# Patient Record
Sex: Female | Born: 1935
Health system: Southern US, Community
[De-identification: ages and names within clinical notes are randomized; demographics above are authoritative.]

## PROBLEM LIST (undated history)

## (undated) DIAGNOSIS — I10 Essential (primary) hypertension: Secondary | ICD-10-CM

## (undated) DIAGNOSIS — M797 Fibromyalgia: Secondary | ICD-10-CM

## (undated) DIAGNOSIS — G2581 Restless legs syndrome: Secondary | ICD-10-CM

## (undated) HISTORY — DX: Fibromyalgia: M79.7

## (undated) HISTORY — PX: COLONOSCOPY: SHX174

## (undated) HISTORY — DX: Essential (primary) hypertension: I10

## (undated) HISTORY — PX: OTHER SURGICAL HISTORY: SHX169

## (undated) HISTORY — DX: Restless legs syndrome: G25.81

---

## 2002-11-07 ENCOUNTER — Encounter: Payer: Self-pay | Admitting: Internal Medicine

## 2002-11-07 ENCOUNTER — Ambulatory Visit (HOSPITAL_COMMUNITY): Admission: RE | Admit: 2002-11-07 | Discharge: 2002-11-07 | Payer: Self-pay | Admitting: Internal Medicine

## 2004-03-28 ENCOUNTER — Ambulatory Visit (HOSPITAL_COMMUNITY): Admission: RE | Admit: 2004-03-28 | Discharge: 2004-03-28 | Payer: Self-pay | Admitting: Family Medicine

## 2004-10-27 ENCOUNTER — Ambulatory Visit: Payer: Self-pay | Admitting: Family Medicine

## 2004-12-15 ENCOUNTER — Ambulatory Visit: Payer: Self-pay | Admitting: Family Medicine

## 2005-01-19 ENCOUNTER — Ambulatory Visit (HOSPITAL_COMMUNITY): Admission: RE | Admit: 2005-01-19 | Discharge: 2005-01-19 | Payer: Self-pay | Admitting: General Surgery

## 2005-02-21 ENCOUNTER — Ambulatory Visit (HOSPITAL_COMMUNITY): Admission: RE | Admit: 2005-02-21 | Discharge: 2005-02-21 | Payer: Self-pay | Admitting: Family Medicine

## 2005-02-28 ENCOUNTER — Ambulatory Visit: Payer: Self-pay | Admitting: Family Medicine

## 2005-03-29 ENCOUNTER — Ambulatory Visit: Payer: Self-pay | Admitting: Family Medicine

## 2005-03-30 ENCOUNTER — Ambulatory Visit: Payer: Self-pay | Admitting: Family Medicine

## 2005-03-30 ENCOUNTER — Ambulatory Visit (HOSPITAL_COMMUNITY): Admission: RE | Admit: 2005-03-30 | Discharge: 2005-03-30 | Payer: Self-pay | Admitting: Family Medicine

## 2005-04-04 ENCOUNTER — Ambulatory Visit: Payer: Self-pay | Admitting: Family Medicine

## 2005-04-06 ENCOUNTER — Ambulatory Visit (HOSPITAL_COMMUNITY): Admission: RE | Admit: 2005-04-06 | Discharge: 2005-04-06 | Payer: Self-pay | Admitting: Family Medicine

## 2005-07-17 ENCOUNTER — Ambulatory Visit: Payer: Self-pay | Admitting: Family Medicine

## 2005-11-27 ENCOUNTER — Ambulatory Visit: Payer: Self-pay | Admitting: Family Medicine

## 2006-02-27 ENCOUNTER — Ambulatory Visit: Payer: Self-pay | Admitting: Family Medicine

## 2006-03-08 ENCOUNTER — Ambulatory Visit: Payer: Self-pay | Admitting: Family Medicine

## 2006-03-09 ENCOUNTER — Ambulatory Visit (HOSPITAL_COMMUNITY): Admission: RE | Admit: 2006-03-09 | Discharge: 2006-03-09 | Payer: Self-pay | Admitting: Family Medicine

## 2006-03-20 ENCOUNTER — Ambulatory Visit: Payer: Self-pay | Admitting: Family Medicine

## 2006-03-21 ENCOUNTER — Ambulatory Visit (HOSPITAL_COMMUNITY): Admission: RE | Admit: 2006-03-21 | Discharge: 2006-03-21 | Payer: Self-pay | Admitting: Family Medicine

## 2006-04-02 ENCOUNTER — Ambulatory Visit (HOSPITAL_COMMUNITY): Admission: RE | Admit: 2006-04-02 | Discharge: 2006-04-02 | Payer: Self-pay | Admitting: Family Medicine

## 2006-04-23 ENCOUNTER — Other Ambulatory Visit: Admission: RE | Admit: 2006-04-23 | Discharge: 2006-04-23 | Payer: Self-pay | Admitting: Family Medicine

## 2006-04-23 ENCOUNTER — Ambulatory Visit: Payer: Self-pay | Admitting: Family Medicine

## 2006-05-23 ENCOUNTER — Ambulatory Visit (HOSPITAL_COMMUNITY): Admission: RE | Admit: 2006-05-23 | Discharge: 2006-05-23 | Payer: Self-pay | Admitting: General Surgery

## 2006-07-18 ENCOUNTER — Ambulatory Visit (HOSPITAL_COMMUNITY): Admission: RE | Admit: 2006-07-18 | Discharge: 2006-07-18 | Payer: Self-pay | Admitting: Internal Medicine

## 2006-07-18 ENCOUNTER — Encounter (INDEPENDENT_AMBULATORY_CARE_PROVIDER_SITE_OTHER): Payer: Self-pay | Admitting: Specialist

## 2006-08-27 ENCOUNTER — Ambulatory Visit: Payer: Self-pay | Admitting: Family Medicine

## 2006-09-14 ENCOUNTER — Ambulatory Visit: Payer: Self-pay | Admitting: Family Medicine

## 2006-11-27 ENCOUNTER — Ambulatory Visit: Payer: Self-pay | Admitting: Family Medicine

## 2006-12-11 ENCOUNTER — Ambulatory Visit (HOSPITAL_COMMUNITY): Admission: RE | Admit: 2006-12-11 | Discharge: 2006-12-11 | Payer: Self-pay | Admitting: Family Medicine

## 2006-12-11 ENCOUNTER — Ambulatory Visit: Payer: Self-pay | Admitting: Family Medicine

## 2006-12-27 ENCOUNTER — Encounter: Payer: Self-pay | Admitting: Family Medicine

## 2006-12-27 LAB — CONVERTED CEMR LAB
ALT: 11 units/L (ref 0–35)
Alkaline Phosphatase: 60 units/L (ref 39–117)
Basophils Relative: 0 % (ref 0–1)
Chloride: 101 meq/L (ref 96–112)
Cholesterol: 155 mg/dL (ref 0–200)
Creatinine, Ser: 0.89 mg/dL (ref 0.40–1.20)
Glucose, Bld: 101 mg/dL — ABNORMAL HIGH (ref 70–99)
HCT: 37.7 % (ref 36.0–46.0)
HDL: 36 mg/dL — ABNORMAL LOW (ref >39)
Hemoglobin: 11.6 g/dL — ABNORMAL LOW (ref 12.0–15.0)
LDL Cholesterol: 92 mg/dL (ref 0–99)
Lymphocytes Relative: 27 % (ref 12–46)
Lymphs Abs: 2.1 10*3/uL (ref 0.7–3.3)
Monocytes Relative: 7 % (ref 3–11)
Neutro Abs: 4.7 10*3/uL (ref 1.7–7.7)
Platelets: 326 10*3/uL (ref 150–400)
RBC: 4.28 M/uL (ref 3.87–5.11)
RDW: 13.6 % (ref 11.5–14.0)
Sodium: 139 meq/L (ref 135–145)
TSH: 1.209 microintl units/mL (ref 0.350–5.50)
Total Protein: 7.2 g/dL (ref 6.0–8.3)
Triglycerides: 135 mg/dL (ref <150)

## 2007-01-01 ENCOUNTER — Ambulatory Visit: Payer: Self-pay | Admitting: Family Medicine

## 2007-01-08 ENCOUNTER — Ambulatory Visit: Payer: Self-pay | Admitting: Family Medicine

## 2007-01-09 ENCOUNTER — Encounter: Payer: Self-pay | Admitting: Family Medicine

## 2007-02-14 ENCOUNTER — Ambulatory Visit: Payer: Self-pay | Admitting: Family Medicine

## 2007-02-15 ENCOUNTER — Encounter: Payer: Self-pay | Admitting: Family Medicine

## 2007-02-15 LAB — CONVERTED CEMR LAB
Ketones, ur: NEGATIVE mg/dL
Nitrite: NEGATIVE
Specific Gravity, Urine: 1.017 (ref 1.005–1.03)
Urobilinogen, UA: 0.2 (ref 0.0–1.0)
pH: 6 (ref 5.0–8.0)

## 2007-03-28 ENCOUNTER — Ambulatory Visit: Payer: Self-pay | Admitting: Family Medicine

## 2007-03-28 ENCOUNTER — Ambulatory Visit (HOSPITAL_COMMUNITY): Admission: RE | Admit: 2007-03-28 | Discharge: 2007-03-28 | Payer: Self-pay | Admitting: Family Medicine

## 2007-03-28 LAB — CONVERTED CEMR LAB
BUN: 16 mg/dL (ref 6–23)
Calcium: 9.6 mg/dL (ref 8.4–10.5)
Chloride: 102 meq/L (ref 96–112)
Potassium: 4.1 meq/L (ref 3.5–5.3)

## 2007-04-05 ENCOUNTER — Ambulatory Visit (HOSPITAL_COMMUNITY): Admission: RE | Admit: 2007-04-05 | Discharge: 2007-04-05 | Payer: Self-pay | Admitting: Family Medicine

## 2007-04-05 ENCOUNTER — Ambulatory Visit: Payer: Self-pay | Admitting: Family Medicine

## 2007-05-16 ENCOUNTER — Ambulatory Visit: Payer: Self-pay | Admitting: Family Medicine

## 2007-06-04 ENCOUNTER — Ambulatory Visit: Payer: Self-pay | Admitting: Family Medicine

## 2007-07-02 ENCOUNTER — Ambulatory Visit: Payer: Self-pay | Admitting: Family Medicine

## 2007-09-03 ENCOUNTER — Ambulatory Visit: Payer: Self-pay | Admitting: Family Medicine

## 2007-10-17 ENCOUNTER — Encounter: Payer: Self-pay | Admitting: Family Medicine

## 2008-04-14 ENCOUNTER — Ambulatory Visit (HOSPITAL_COMMUNITY): Admission: RE | Admit: 2008-04-14 | Discharge: 2008-04-14 | Payer: Self-pay | Admitting: Internal Medicine

## 2009-04-26 ENCOUNTER — Ambulatory Visit (HOSPITAL_COMMUNITY): Admission: RE | Admit: 2009-04-26 | Discharge: 2009-04-26 | Payer: Self-pay | Admitting: Internal Medicine

## 2010-03-17 ENCOUNTER — Ambulatory Visit (HOSPITAL_COMMUNITY): Admission: RE | Admit: 2010-03-17 | Discharge: 2010-03-17 | Payer: Self-pay | Admitting: Internal Medicine

## 2010-03-24 ENCOUNTER — Ambulatory Visit (HOSPITAL_COMMUNITY): Admission: RE | Admit: 2010-03-24 | Discharge: 2010-03-24 | Payer: Self-pay | Admitting: Obstetrics and Gynecology

## 2010-05-17 ENCOUNTER — Ambulatory Visit (HOSPITAL_COMMUNITY): Admission: RE | Admit: 2010-05-17 | Discharge: 2010-05-17 | Payer: Self-pay | Admitting: Internal Medicine

## 2010-11-15 NOTE — Letter (Signed)
Summary: RPC chart  RPC chart   Imported By: Curtis Sites 05/24/2010 10:54:58  _____________________________________________________________________  External Attachment:    Type:   Image     Comment:   External Document

## 2011-03-03 NOTE — H&P (Signed)
NAME:  [name] [name], [name] [name]                  ACCOUNT NO.:  [id number]   MEDICAL RECORD NO.:  [id number]          PATIENT TYPE:  AMB   LOCATION:                                 FACILITY:   PHYSICIAN:  Dalia Heading, M.D.  DATE OF BIRTH:  25-May-1933   DATE OF ADMISSION:  DATE OF DISCHARGE:  LH                                HISTORY & PHYSICAL   CHIEF COMPLAINT:  Need for screening colonoscopy.   HISTORY OF PRESENT ILLNESS:  The patient is a 77 year old black female who  is referred for endoscopic evaluation.  She needs a colonoscopy for  screening purposes.  No abdominal pain, weight loss, nausea or vomiting,  diarrhea, constipation, melena or hematochezia have been noted.  She has  never had a colonoscopy.  There is no family history of colon carcinoma.   PAST MEDICAL HISTORY:  Hypertension.   PAST SURGICAL HISTORY:  Unremarkable.   CURRENT MEDICATIONS:  Univasc.   ALLERGIES:  ASPIRIN.  PENICILLIN.  MUSHROOMS.   REVIEW OF SYSTEMS:  Noncontributory.   PHYSICAL EXAMINATION:  GENERAL:  The patient is a well-developed, well-  nourished black female in no acute distress.  VITAL SIGNS:  She is afebrile and vital signs are stable.  LUNGS:  Clear to auscultation with equal breath sounds bilaterally.  HEART:  Examination reveals a regular rate and rhythm without S3, S4 or  murmurs.  ABDOMEN:  Soft, nontender, nondistended.  No hepatosplenomegaly or masses  are noted.  RECTAL:  Examination was deferred to the procedure.   IMPRESSION:  Need for screening colonoscopy.   PLAN:  The patient is scheduled for a colonoscopy on January 19, 2005.  The  risks and benefits of the procedure including bleeding and perforation were  fully explained to the patient, gave informed consent.      MAJ/MEDQ  D:  01/10/2005  T:  01/10/2005  Job:  [id number]   cc:   Milus Mallick. Lodema Hong, M.D.  [address]  [place], [place] [address]  Fax: [phone]

## 2011-03-03 NOTE — Op Note (Signed)
NAME:  [name] [name], [name] [name]                  ACCOUNT NO.:  [id number]   MEDICAL RECORD NO.:  [id number]          PATIENT TYPE:  AMB   LOCATION:  DAY                           FACILITY:  [facility]   PHYSICIAN:  Dalia Heading, M.D.  DATE OF BIRTH:  Mar 21, 1933   DATE OF PROCEDURE:  07/18/2006  DATE OF DISCHARGE:                                 OPERATIVE REPORT   PREOPERATIVE DIAGNOSIS:  Neoplasm, chest wall, unspecified.   POSTOPERATIVE DIAGNOSIS:  Neoplasm, chest wall, unspecified.   PROCEDURE:  Excision of neoplasm, chest wall.   SURGEON:  Dr. Franky Macho.   ANESTHESIA:  General endotracheal.   INDICATIONS:  The patient is a 77 year old black female presents with an  enlarging mass on the chest wall on the right side from the mid axillary  line extending posteriorly.  The patient comes to the operating room for  excision of the mass.  Risks and benefits of the procedure including  bleeding, infection, and recurrence of the neoplasm were fully explained to  the patient, who gave informed consent.   PROCEDURE NOTE:  The patient was placed in the left lateral decubitus  position after induction of general endotracheal anesthesia.  The right  chest wall and back were prepped and draped in the usual sterile technique  with Betadine.  Surgical site confirmation was performed.   A transverse incision was made over the mass.  The mass was noted be  subcutaneous in nature and appeared to be benign.  It was fully excised down  to the fascia.  The specimen sent to pathology for further examination.  It  was approximately greater than 16 cm in its greatest diameter.  Any bleeding  was controlled using Bovie electrocautery.  A #10 flat Jackson-Pratt drain  was placed into this region into the subcutaneous region and brought through  a separate stab wound inferior to the incision line.  It was secured in  place at the skin level using a 3-0 nylon interrupted suture.  The  subcutaneous layer was  reapproximated using 3-0 Vicryl interrupted sutures.  Skin was closed using staples.  0.5 cm Sensorcaine was instilled in the  surrounding wound.  Betadine ointment dry sterile dressing were applied.   All tape and needle counts correct at the end of procedure.  The patient was  extubated in the operating room went back to recovery room awake in stable  condition.   COMPLICATIONS:  None.   SPECIMEN:  Neoplasm, chest wall.   BLOOD LOSS:  Minimal.   DRAINS:  Jackson-Pratt drain in the subcutaneous space.      Dalia Heading, M.D.  Electronically Signed     MAJ/MEDQ  D:  07/18/2006  T:  07/19/2006  Job:  [id number]   cc:   Milus Mallick. Lodema Hong, M.D.  Fax: [phone]

## 2011-03-03 NOTE — H&P (Signed)
NAME:  [name] [name]                  ACCOUNT NO.:  [id number]   MEDICAL RECORD NO.:  [id number]          PATIENT TYPE:  AMB   LOCATION:  DAY                           FACILITY:  [facility]   PHYSICIAN:  Dalia Heading, M.D.  DATE OF BIRTH:  [birth year]   DATE OF ADMISSION:  DATE OF DISCHARGE:  LH                                HISTORY & PHYSICAL   CHIEF COMPLAINT:  Neoplasm, right flank.   HISTORY OF PRESENT ILLNESS:  The patient is a 77 year old black female who  is referred for evaluation and treatment of an enlarging soft tissue mass  along the right side of the chest wall.  It has been present for many years,  but recently has increased in size and is causing her discomfort.   PAST MEDICAL HISTORY:  Hypertension, osteoarthritis, obesity.   PAST SURGICAL HISTORY:  Partial hysterectomy.   CURRENT MEDICATIONS:  Benicar.   ALLERGIES:  ASPIRIN, PENICILLIN.   REVIEW OF SYSTEMS:  Noncontributory.   PHYSICAL EXAMINATION:  GENERAL:  The patient is a well-developed, well-  nourished black female in no acute distress.  LUNGS:  Clear to auscultation with equal breath sounds bilaterally.  HEART:  Examination reveals a regular rate and rhythm without S3, S4, or  murmurs.  TRUNK:  Examination reveals a greater than 16 cm ovoid, soft,  subcutaneous  mass in the right mid axilla extending posteriorly.   CT scan of the chest reveals a soft tissue mass without evidence of a  liposarcoma.   IMPRESSION:  Neoplasm, trunk.   PLAN:  The patient is scheduled for excision of the neoplasm, trunk on  July 18, 2006.  The risks and benefits of the procedure including bleeding  and infection were fully explained to the patient, who gave informed  consent.      Dalia Heading, M.D.  Electronically Signed     MAJ/MEDQ  D:  07/10/2006  T:  07/10/2006  Job:  [id number]   cc:   Milus Mallick. Lodema Hong, M.D.  Fax: [phone]

## 2011-05-12 ENCOUNTER — Other Ambulatory Visit (HOSPITAL_COMMUNITY): Payer: Self-pay | Admitting: Internal Medicine

## 2011-05-12 DIAGNOSIS — Z139 Encounter for screening, unspecified: Secondary | ICD-10-CM

## 2011-05-22 ENCOUNTER — Ambulatory Visit (HOSPITAL_COMMUNITY)
Admission: RE | Admit: 2011-05-22 | Discharge: 2011-05-22 | Disposition: A | Discharge status: Home / Self Care | Payer: Medicare Other | Source: Ambulatory Visit | Attending: Internal Medicine | Admitting: Internal Medicine

## 2011-05-22 DIAGNOSIS — Z1231 Encounter for screening mammogram for malignant neoplasm of breast: Secondary | ICD-10-CM | POA: Insufficient documentation

## 2011-05-22 DIAGNOSIS — Z139 Encounter for screening, unspecified: Secondary | ICD-10-CM

## 2012-04-25 ENCOUNTER — Other Ambulatory Visit (HOSPITAL_COMMUNITY): Payer: Self-pay | Admitting: Internal Medicine

## 2012-04-25 DIAGNOSIS — Z139 Encounter for screening, unspecified: Secondary | ICD-10-CM

## 2012-05-23 ENCOUNTER — Ambulatory Visit (HOSPITAL_COMMUNITY)
Admission: RE | Admit: 2012-05-23 | Discharge: 2012-05-23 | Disposition: A | Discharge status: Home / Self Care | Payer: Medicare Other | Source: Ambulatory Visit | Attending: Internal Medicine | Admitting: Internal Medicine

## 2012-05-23 DIAGNOSIS — Z1231 Encounter for screening mammogram for malignant neoplasm of breast: Secondary | ICD-10-CM | POA: Insufficient documentation

## 2012-05-23 DIAGNOSIS — Z139 Encounter for screening, unspecified: Secondary | ICD-10-CM

## 2013-06-30 ENCOUNTER — Other Ambulatory Visit (HOSPITAL_COMMUNITY): Payer: Self-pay | Admitting: Internal Medicine

## 2013-06-30 DIAGNOSIS — Z139 Encounter for screening, unspecified: Secondary | ICD-10-CM

## 2013-07-03 ENCOUNTER — Ambulatory Visit (HOSPITAL_COMMUNITY)
Admission: RE | Admit: 2013-07-03 | Discharge: 2013-07-03 | Disposition: A | Discharge status: Home / Self Care | Payer: Medicare Other | Source: Ambulatory Visit | Attending: Internal Medicine | Admitting: Internal Medicine

## 2013-07-03 DIAGNOSIS — Z1231 Encounter for screening mammogram for malignant neoplasm of breast: Secondary | ICD-10-CM | POA: Insufficient documentation

## 2013-07-03 DIAGNOSIS — Z139 Encounter for screening, unspecified: Secondary | ICD-10-CM

## 2013-12-23 ENCOUNTER — Other Ambulatory Visit: Payer: Self-pay | Admitting: Obstetrics & Gynecology

## 2014-08-04 ENCOUNTER — Other Ambulatory Visit (HOSPITAL_COMMUNITY): Payer: Self-pay | Admitting: Internal Medicine

## 2014-08-04 DIAGNOSIS — Z1231 Encounter for screening mammogram for malignant neoplasm of breast: Secondary | ICD-10-CM

## 2014-08-13 ENCOUNTER — Ambulatory Visit (HOSPITAL_COMMUNITY)
Admission: RE | Admit: 2014-08-13 | Discharge: 2014-08-13 | Disposition: A | Discharge status: Home / Self Care | Payer: Medicare Other | Source: Ambulatory Visit | Attending: Internal Medicine | Admitting: Internal Medicine

## 2014-08-13 DIAGNOSIS — Z1231 Encounter for screening mammogram for malignant neoplasm of breast: Secondary | ICD-10-CM | POA: Insufficient documentation

## 2015-08-03 IMAGING — MG MM DIGITAL SCREENING
5 series · 5 of 5 positions shown · non-contrast
Comparison: Previous exam(s)

ACR Breast Density Category a: The breast tissue is almost entirely
fatty.

CLINICAL DATA: Screening.

EXAM:
DIGITAL SCREENING BILATERAL MAMMOGRAM WITH CAD

[L CC]
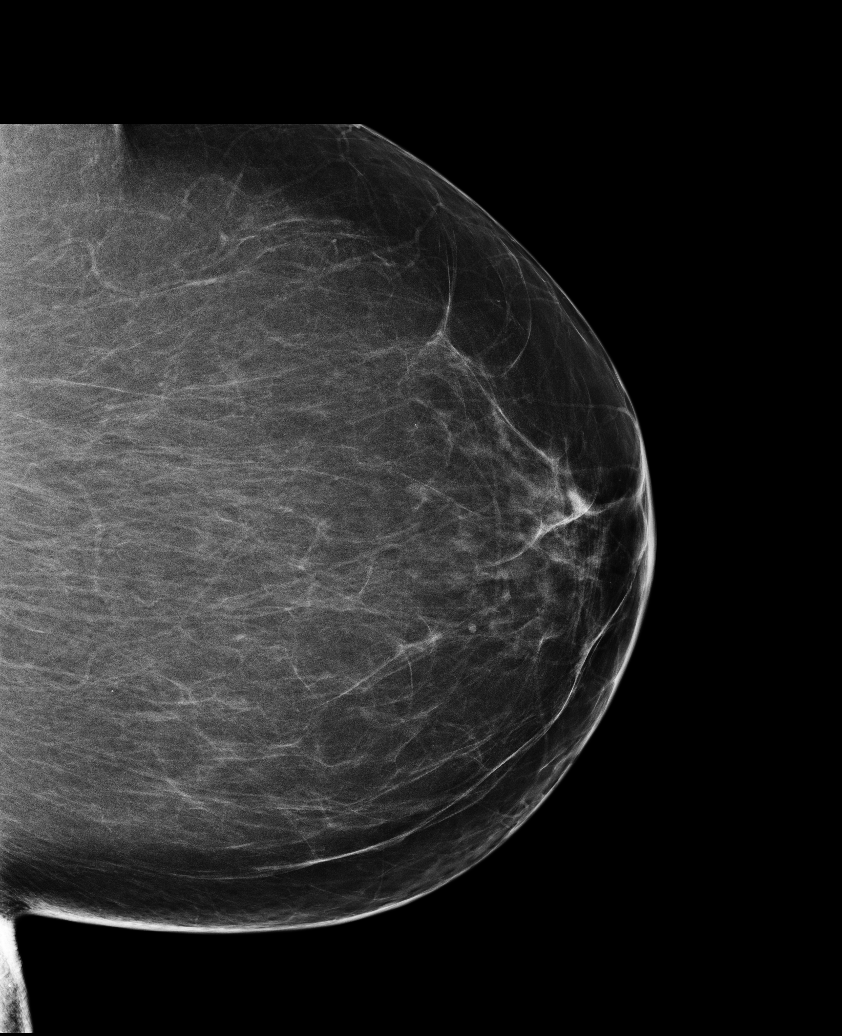

[L MLO]
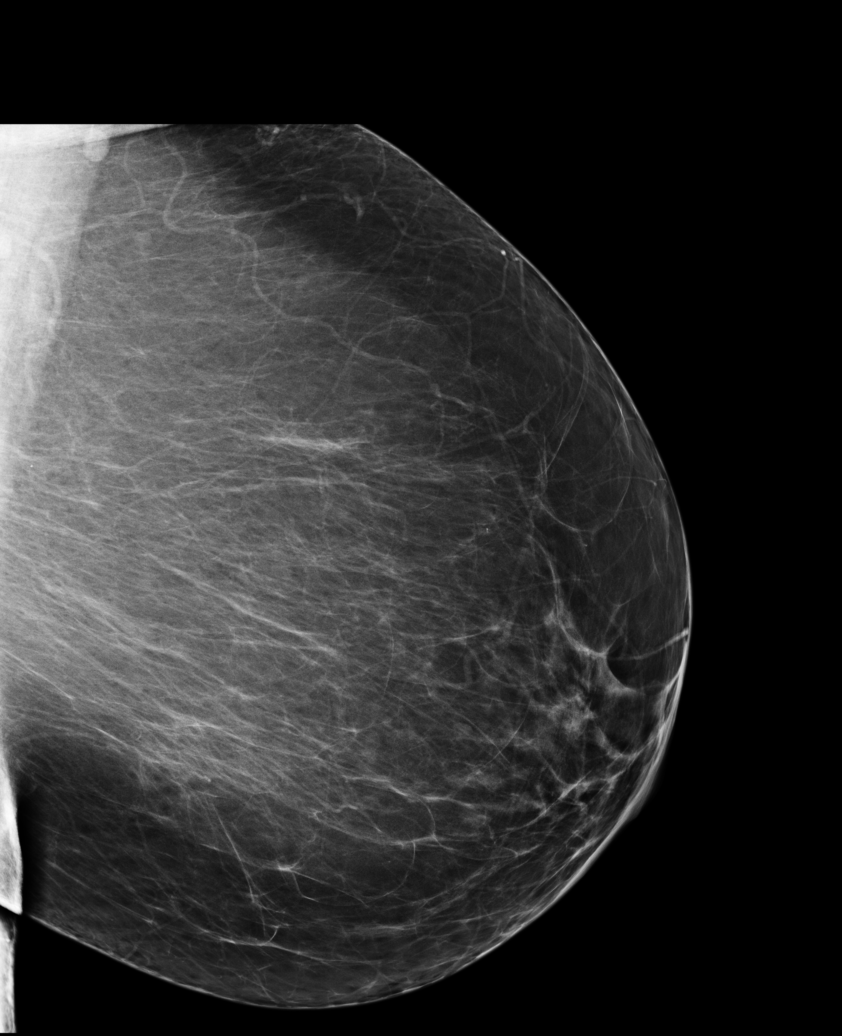

[R CC (1 of 2)]
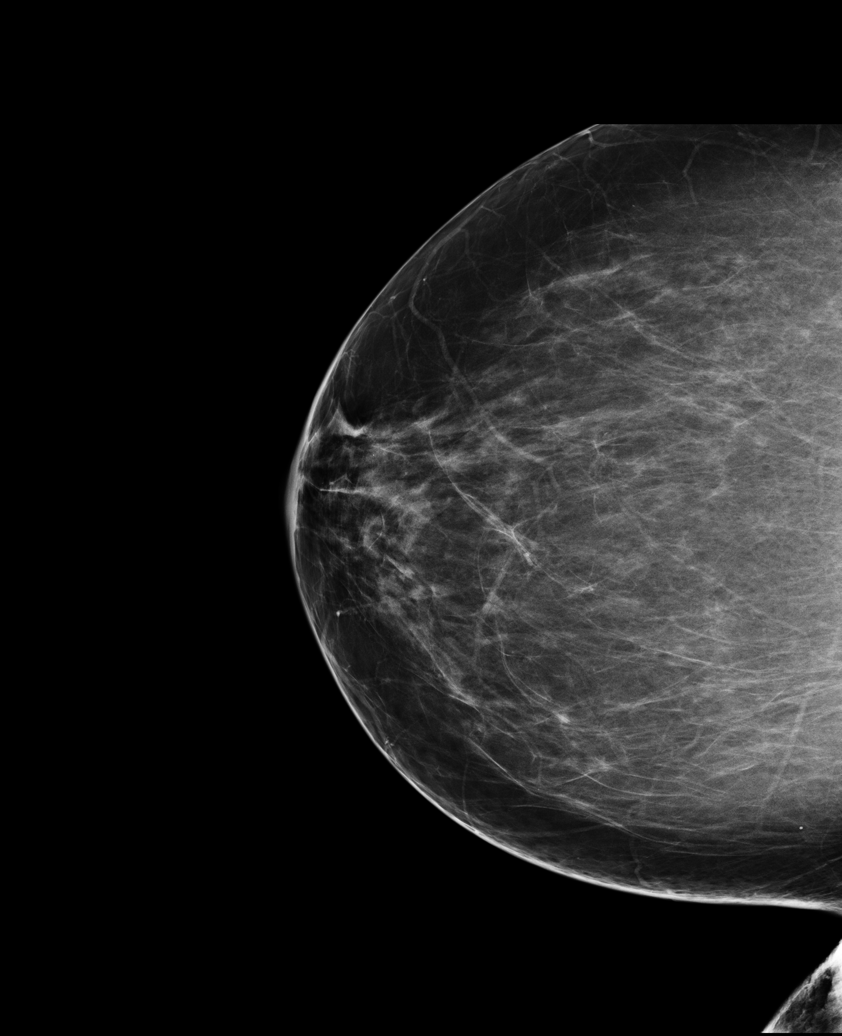

[R MLO]
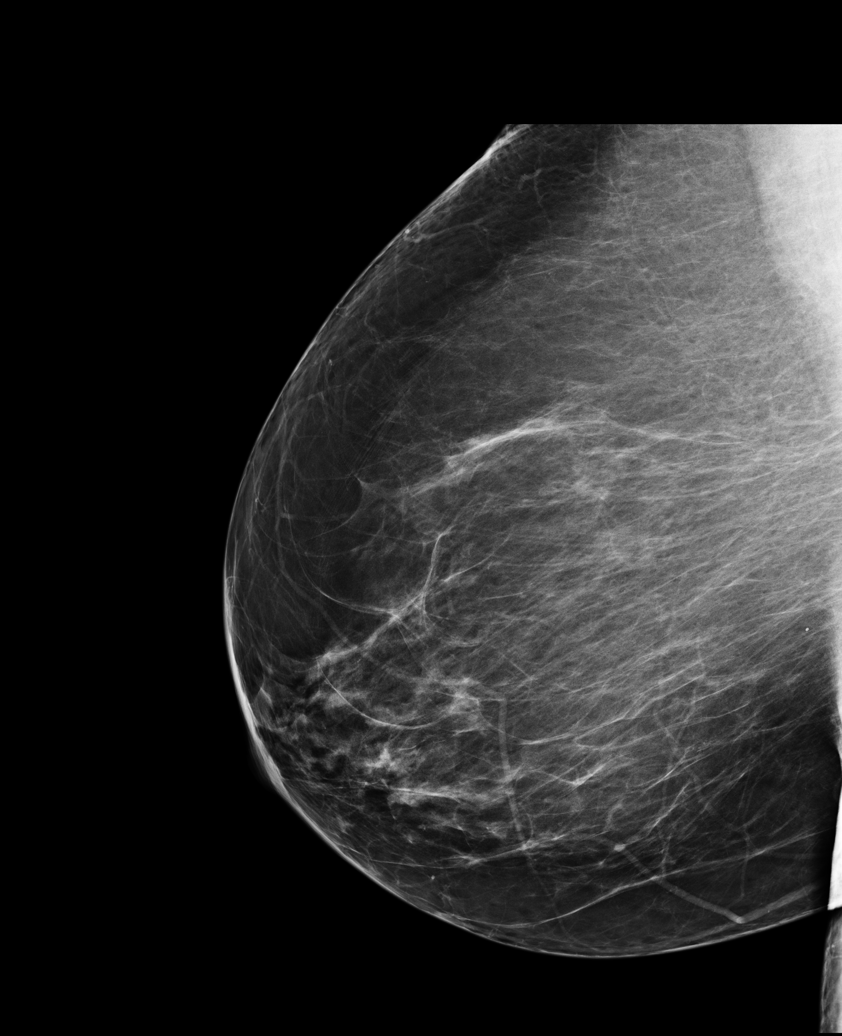

[R CC (2 of 2)]
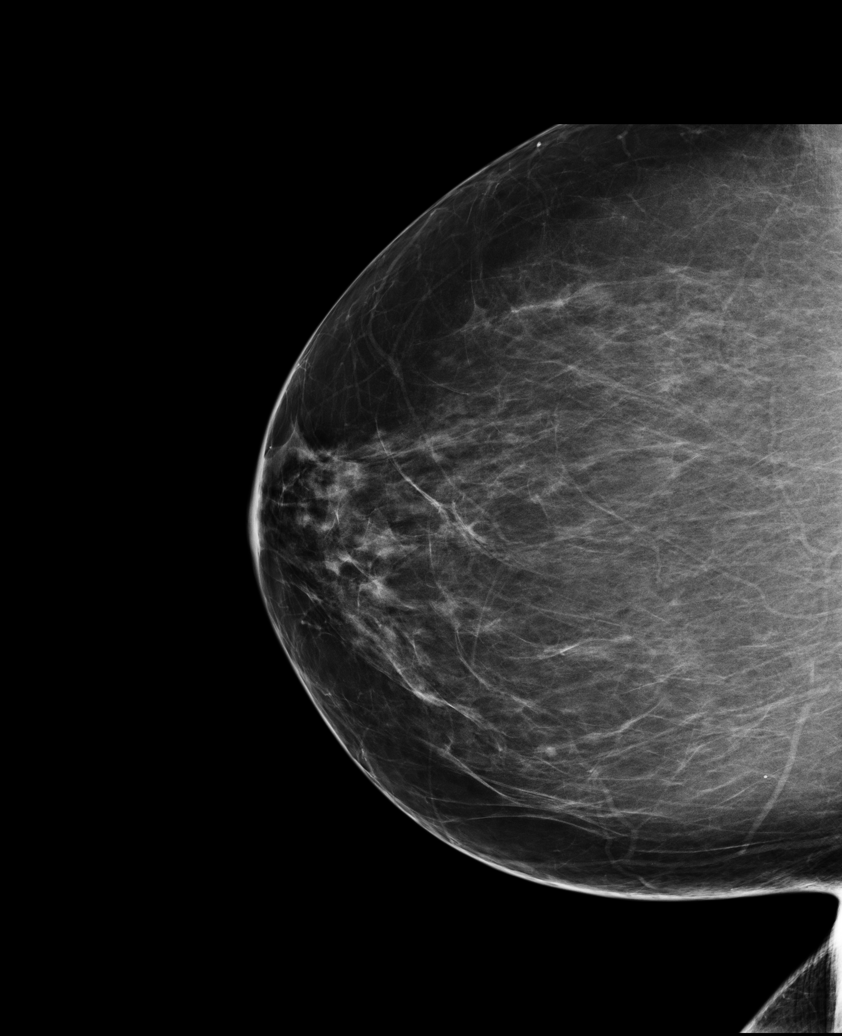

[5 of 5 positions shown; findings below may reference images not displayed]

FINDINGS: There are no findings suspicious for malignancy. Images were
processed with CAD.
IMPRESSION: No mammographic evidence of malignancy. A result letter of this
screening mammogram will be mailed directly to the patient.

RECOMMENDATION:
Screening mammogram in one year. (Code:JV-[id number])

BI-RADS CATEGORY  1: Negative.

## 2015-09-17 ENCOUNTER — Other Ambulatory Visit (HOSPITAL_COMMUNITY): Payer: Self-pay | Admitting: Internal Medicine

## 2015-09-17 DIAGNOSIS — Z1231 Encounter for screening mammogram for malignant neoplasm of breast: Secondary | ICD-10-CM

## 2015-09-23 ENCOUNTER — Ambulatory Visit (HOSPITAL_COMMUNITY)
Admission: RE | Admit: 2015-09-23 | Discharge: 2015-09-23 | Disposition: A | Discharge status: Home / Self Care | Payer: Medicare Other | Source: Ambulatory Visit | Attending: Internal Medicine | Admitting: Internal Medicine

## 2015-09-23 ENCOUNTER — Other Ambulatory Visit (HOSPITAL_COMMUNITY): Payer: Self-pay | Admitting: Internal Medicine

## 2015-09-23 DIAGNOSIS — R52 Pain, unspecified: Secondary | ICD-10-CM

## 2015-09-23 DIAGNOSIS — M545 Low back pain: Secondary | ICD-10-CM | POA: Diagnosis not present

## 2015-09-23 DIAGNOSIS — Z1231 Encounter for screening mammogram for malignant neoplasm of breast: Secondary | ICD-10-CM | POA: Diagnosis present

## 2016-08-21 ENCOUNTER — Other Ambulatory Visit (HOSPITAL_COMMUNITY): Payer: Self-pay | Admitting: Internal Medicine

## 2016-08-21 DIAGNOSIS — Z1231 Encounter for screening mammogram for malignant neoplasm of breast: Secondary | ICD-10-CM

## 2016-09-15 ENCOUNTER — Ambulatory Visit (INDEPENDENT_AMBULATORY_CARE_PROVIDER_SITE_OTHER): Payer: Medicare Other | Admitting: Obstetrics & Gynecology

## 2016-09-15 ENCOUNTER — Encounter (INDEPENDENT_AMBULATORY_CARE_PROVIDER_SITE_OTHER): Payer: Self-pay

## 2016-09-15 ENCOUNTER — Encounter: Payer: Self-pay | Admitting: Obstetrics & Gynecology

## 2016-09-15 DIAGNOSIS — N812 Incomplete uterovaginal prolapse: Secondary | ICD-10-CM | POA: Diagnosis not present

## 2016-09-15 MED ORDER — GERHARDT'S BUTT CREAM: 1.0000 "application " | TOPICAL_CREAM | Freq: Three times a day (TID) | CUTANEOUS | 11 refills | Status: DC

## 2016-09-15 MED ORDER — METRONIDAZOLE 500 MG PO TABS: 500.0000 mg | ORAL_TABLET | Freq: Two times a day (BID) | ORAL | 0 refills | Status: DC

## 2016-09-15 NOTE — Progress Notes (Signed)
Chief Complaint  Patient presents with  . Pessary Check, vaginal discharge, bladder leakage    There were no vitals taken for this visit.  82 y.o. No obstetric history on file. No LMP recorded. Patient has had a hysterectomy. The current method of family planning is status post hysterectomy.  Outpatient Encounter Prescriptions as of 09/15/2016  Medication Sig  . acetaminophen (TYLENOL) 325 MG tablet Take 650 mg by mouth every 6 (six) hours as needed.  . Azilsartan Medoxomil (EDARBI PO) Take 20 mg by mouth daily.  . cholecalciferol (VITAMIN D) 1000 units tablet Take 1,000 Units by mouth daily.  . naproxen sodium (ANAPROX) 220 MG tablet Take 220 mg by mouth as needed.  Marland Kitchen. OVER THE COUNTER MEDICATION 1 capsule daily.  . Hydrocortisone (GERHARDT'S BUTT CREAM) CREA Apply 1 application topically 3 (three) times daily.  . metroNIDAZOLE (FLAGYL) 500 MG tablet Take 1 tablet (500 mg total) by mouth 2 (two) times daily.  . [DISCONTINUED] metroNIDAZOLE (METROGEL) 0.75 % vaginal gel USE AS DIRECTED. (Patient not taking: Reported on 09/15/2016)   No facility-administered encounter medications on file as of 09/15/2016.     Subjective Pt has a Gelhorn pessary #6 in place and has had for several years Starting to have more bleeding and discharge Has not had it checked or changed in several years  Objective Gelhorn pessary is removed without too much difficulty The vaginal mucosa is normal with no evidence of any undue pressure  Replaced with a Milex ring with support #6 with good fit and comfortable to patient  Pertinent ROS Some urinary loss, episodic  Labs or studies     Impression Diagnoses this Encounter::   ICD-9-CM ICD-10-CM   1. Uterovaginal prolapse, incomplete 618.2 N81.2     Established relevant diagnosis(es):   Plan/Recommendations: Meds ordered this encounter  Medications  . Azilsartan Medoxomil (EDARBI PO)    Sig: Take 20 mg by mouth daily.  . cholecalciferol  (VITAMIN D) 1000 units tablet    Sig: Take 1,000 Units by mouth daily.  . naproxen sodium (ANAPROX) 220 MG tablet    Sig: Take 220 mg by mouth as needed.  Marland Kitchen. OVER THE COUNTER MEDICATION    Sig: 1 capsule daily.  Marland Kitchen. acetaminophen (TYLENOL) 325 MG tablet    Sig: Take 650 mg by mouth every 6 (six) hours as needed.  . metroNIDAZOLE (FLAGYL) 500 MG tablet    Sig: Take 1 tablet (500 mg total) by mouth 2 (two) times daily.    Dispense:  14 tablet    Refill:  0  . Hydrocortisone (GERHARDT'S BUTT CREAM) CREA    Sig: Apply 1 application topically 3 (three) times daily.    Dispense:  1 each    Refill:  11    Labs or Scans Ordered: No orders of the defined types were placed in this encounter.   Management:: milex ring with support # 6 placed, Gelhorn removed  Follow up Return in about 1 month (around 10/16/2016) for Follow up, with Dr Despina HiddenEure.        All questions were answered.  Past Medical History:  Diagnosis Date  . Hypertension     Past Surgical History:  Procedure Laterality Date  . benign tumor removed     removed from back and lateral right side  . COLONOSCOPY      OB History    No data available      Allergies  Allergen Reactions  . Asa [Aspirin]  Stomach irritation   . Penicillins Swelling    Social History   Social History  . Marital status: Single    Spouse name: N/A  . Number of children: N/A  . Years of education: N/A   Social History Main Topics  . Smoking status: Former Games developermoker  . Smokeless tobacco: Never Used  . Alcohol use No  . Drug use: No  . Sexual activity: Not Currently   Other Topics Concern  . Not on file   Social History Narrative  . No narrative on file    Family History  Problem Relation Age of Onset  . Cancer Sister     ovarian  . Cancer Sister     throat

## 2016-09-22 ENCOUNTER — Ambulatory Visit (INDEPENDENT_AMBULATORY_CARE_PROVIDER_SITE_OTHER): Payer: Medicare Other | Admitting: Obstetrics & Gynecology

## 2016-09-22 ENCOUNTER — Telehealth: Payer: Self-pay | Admitting: Obstetrics & Gynecology

## 2016-09-22 ENCOUNTER — Encounter: Payer: Self-pay | Admitting: Obstetrics & Gynecology

## 2016-09-22 VITALS — BP 150/80 | HR 72 | Wt 212.0 lb

## 2016-09-22 DIAGNOSIS — N812 Incomplete uterovaginal prolapse: Secondary | ICD-10-CM | POA: Diagnosis not present

## 2016-09-22 DIAGNOSIS — Z4689 Encounter for fitting and adjustment of other specified devices: Secondary | ICD-10-CM | POA: Diagnosis not present

## 2016-09-22 DIAGNOSIS — I10 Essential (primary) hypertension: Secondary | ICD-10-CM | POA: Diagnosis not present

## 2016-09-22 NOTE — Telephone Encounter (Signed)
Pt called stating that her pessary has came out and that she would like to see Dr. Despina HiddenEure so she could have it placed back in. Please contact pt

## 2016-09-22 NOTE — Progress Notes (Signed)
Chief Complaint  Patient presents with  . pessary came out    Blood pressure (!) 150/80, pulse 72, weight 212 lb (96.2 kg).  [name] [name] presents today for routine follow up related to her pessary.   She uses a Milex ring #6, I changes out her Gelhorn last week and she likes this much more She said she could not go to the bathroom strained and it moved so she took it out She wants It back in, it feels like the right size on exam, not putting undue pressure on her rectum She reports no vaginal discharge or vaginal bleeding.  Exam reveals no undue vaginal mucosal pressure of breakdown, no discharge and no vaginal bleeding.  The pessary is removed, cleaned and replaced without difficulty.    [name] will be sen back in keep scheduled months for continued follow up.  Lazaro ArmsEURE,LUTHER H, MD  09/22/2016 12:29 PM

## 2016-09-29 ENCOUNTER — Ambulatory Visit (HOSPITAL_COMMUNITY)
Admission: RE | Admit: 2016-09-29 | Discharge: 2016-09-29 | Disposition: A | Discharge status: Home / Self Care | Payer: Medicare Other | Source: Ambulatory Visit | Attending: Internal Medicine | Admitting: Internal Medicine

## 2016-09-29 DIAGNOSIS — Z1231 Encounter for screening mammogram for malignant neoplasm of breast: Secondary | ICD-10-CM | POA: Insufficient documentation

## 2016-10-20 ENCOUNTER — Ambulatory Visit (INDEPENDENT_AMBULATORY_CARE_PROVIDER_SITE_OTHER): Payer: Medicare Other | Admitting: Obstetrics & Gynecology

## 2016-10-20 ENCOUNTER — Encounter: Payer: Self-pay | Admitting: Obstetrics & Gynecology

## 2016-10-20 VITALS — BP 150/80 | HR 68 | Wt 214.0 lb

## 2016-10-20 DIAGNOSIS — N812 Incomplete uterovaginal prolapse: Secondary | ICD-10-CM

## 2016-10-20 NOTE — Progress Notes (Signed)
Chief Complaint  Patient presents with  . Follow-up    pessary    Blood pressure (!) 150/80, pulse 68, weight 214 lb (97.1 kg).  [name] [name] presents today for routine follow up related to her pessary.   She uses a Milex ring #6 She reports no vaginal discharge or vaginal bleeding.  Exam reveals no undue vaginal mucosal pressure of breakdown, no discharge and no vaginal bleeding.  The pessary is removed, cleaned and replaced without difficulty.    [name] [name] will be sen back in 3 months for continued follow up.  [name] [name],[name] Malhotra H, MD  10/20/2016 10:45 AM

## 2016-12-20 ENCOUNTER — Other Ambulatory Visit (HOSPITAL_COMMUNITY): Payer: Self-pay | Admitting: Internal Medicine

## 2016-12-20 DIAGNOSIS — I2510 Atherosclerotic heart disease of native coronary artery without angina pectoris: Secondary | ICD-10-CM

## 2016-12-22 ENCOUNTER — Ambulatory Visit (INDEPENDENT_AMBULATORY_CARE_PROVIDER_SITE_OTHER): Payer: Medicare Other | Admitting: Obstetrics & Gynecology

## 2016-12-22 ENCOUNTER — Encounter (INDEPENDENT_AMBULATORY_CARE_PROVIDER_SITE_OTHER): Payer: Self-pay

## 2016-12-22 ENCOUNTER — Encounter: Payer: Self-pay | Admitting: Women's Health

## 2016-12-22 VITALS — BP 130/78 | HR 69 | Wt 213.0 lb

## 2016-12-22 DIAGNOSIS — N812 Incomplete uterovaginal prolapse: Secondary | ICD-10-CM

## 2016-12-22 DIAGNOSIS — Z4689 Encounter for fitting and adjustment of other specified devices: Secondary | ICD-10-CM | POA: Diagnosis not present

## 2016-12-22 NOTE — Progress Notes (Signed)
Chief Complaint  Patient presents with  . Follow-up    pessri    Blood pressure 130/78, pulse 69, weight 213 lb (96.6 kg).  [name] [name] presents today for  follow up related to her pessary.   She is curious if it has "come out of place" She uses a Milex ring #6 She reports no vaginal discharge or vaginal bleeding.  Exam reveals no undue vaginal mucosal pressure of breakdown, no discharge and no vaginal bleeding.  The pessary is removed, cleaned and replaced without difficulty. It is seated properly with good anterior and posterior compartment support    [name] [name] will be sen back in 3 months for continued follow up.  Lazaro ArmsEURE,LUTHER H, MD  12/22/2016 1:11 PM

## 2017-01-09 ENCOUNTER — Other Ambulatory Visit (HOSPITAL_COMMUNITY): Payer: Self-pay | Admitting: Internal Medicine

## 2017-01-09 ENCOUNTER — Ambulatory Visit (HOSPITAL_COMMUNITY)
Admission: RE | Admit: 2017-01-09 | Discharge: 2017-01-09 | Disposition: A | Discharge status: Home / Self Care | Payer: Medicare Other | Source: Ambulatory Visit | Attending: Internal Medicine | Admitting: Internal Medicine

## 2017-01-09 DIAGNOSIS — I2510 Atherosclerotic heart disease of native coronary artery without angina pectoris: Secondary | ICD-10-CM | POA: Insufficient documentation

## 2017-01-09 DIAGNOSIS — I739 Peripheral vascular disease, unspecified: Secondary | ICD-10-CM | POA: Diagnosis not present

## 2017-01-19 ENCOUNTER — Ambulatory Visit: Payer: Medicare Other | Admitting: Obstetrics & Gynecology

## 2017-03-23 ENCOUNTER — Encounter (INDEPENDENT_AMBULATORY_CARE_PROVIDER_SITE_OTHER): Payer: Self-pay

## 2017-03-23 ENCOUNTER — Encounter: Payer: Self-pay | Admitting: Obstetrics & Gynecology

## 2017-03-23 ENCOUNTER — Ambulatory Visit (INDEPENDENT_AMBULATORY_CARE_PROVIDER_SITE_OTHER): Payer: Medicare Other | Admitting: Obstetrics & Gynecology

## 2017-03-23 VITALS — BP 146/88 | HR 80 | Wt 211.0 lb

## 2017-03-23 DIAGNOSIS — Z4689 Encounter for fitting and adjustment of other specified devices: Secondary | ICD-10-CM

## 2017-03-23 DIAGNOSIS — N812 Incomplete uterovaginal prolapse: Secondary | ICD-10-CM | POA: Diagnosis not present

## 2017-03-25 NOTE — Progress Notes (Signed)
Chief Complaint  Patient presents with  . Follow-up    pessary    Blood pressure (!) 146/88, pulse 80, weight 211 lb (95.7 kg).  83 y.o. No obstetric history on file. No LMP recorded. Patient has had a hysterectomy. The current method of family planning is status post hysterectomy.  Outpatient Encounter Prescriptions as of 03/23/2017  Medication Sig  . Azilsartan Medoxomil (EDARBI PO) Take 20 mg by mouth daily.  . Hydrocortisone (GERHARDT'S BUTT CREAM) CREA Apply 1 application topically 3 (three) times daily.  Marland Kitchen. OVER THE COUNTER MEDICATION 1 capsule daily.  Marland Kitchen. acetaminophen (TYLENOL) 325 MG tablet Take 650 mg by mouth every 6 (six) hours as needed.  . cholecalciferol (VITAMIN D) 1000 units tablet Take 1,000 Units by mouth daily.  . meclizine (ANTIVERT) 25 MG tablet Take 25 mg by mouth every 6 (six) hours as needed for dizziness.   No facility-administered encounter medications on file as of 03/23/2017.     Subjective Pt is here for her routine pessary maintenance however she is having more rectal pressure and vaginal bleeding when she wipes  She did have a Gelhorn pessary at one time but I changed her over to a Milex ring #6 and that has worked well The discomfort has been going on for a few weeks is mild not exaserbated by other factors and there is no radiation  Objective On exam the pessary is fitting properly, there is no mucosal breakdown It is not putting undue pressure on the bladder or the rectum There is no BV noted I do not appreciate anything on exam that is different, it is fitting well2  Pertinent ROS No burning with urination, frequency or urgency No nausea, vomiting or diarrhea Nor fever chills or other constitutional symptoms   Labs or studies none    Impression Diagnoses this Encounter::   ICD-10-CM   1. Uterovaginal prolapse, incomplete N81.2   2. Pessary maintenance Z46.89     Established relevant diagnosis(es):   Plan/Recommendations: No  orders of the defined types were placed in this encounter.   Labs or Scans Ordered: No orders of the defined types were placed in this encounter.   Management:: Will leave the pessary out for a week and see patient back and see how she responds  Follow up Return in about 1 week (around 03/30/2017) for Follow up, with Dr Despina HiddenEure.    All questions were answered.  Past Medical History:  Diagnosis Date  . Hypertension     Past Surgical History:  Procedure Laterality Date  . benign tumor removed     removed from back and lateral right side  . COLONOSCOPY      OB History    No data available      Allergies  Allergen Reactions  . Asa [Aspirin]     Stomach irritation   . Penicillins Swelling    Social History   Social History  . Marital status: Single    Spouse name: N/A  . Number of children: N/A  . Years of education: N/A   Social History Main Topics  . Smoking status: Former Games developermoker  . Smokeless tobacco: Never Used  . Alcohol use No  . Drug use: No  . Sexual activity: Not Currently   Other Topics Concern  . None   Social History Narrative  . None    Family History  Problem Relation Age of Onset  . Cancer Sister        ovarian  .  Cancer Sister        throat

## 2017-03-29 ENCOUNTER — Encounter: Payer: Self-pay | Admitting: Obstetrics & Gynecology

## 2017-03-29 ENCOUNTER — Ambulatory Visit (INDEPENDENT_AMBULATORY_CARE_PROVIDER_SITE_OTHER): Payer: Medicare Other | Admitting: Obstetrics & Gynecology

## 2017-03-29 VITALS — BP 150/98 | HR 60 | Wt 210.0 lb

## 2017-03-29 DIAGNOSIS — Z4689 Encounter for fitting and adjustment of other specified devices: Secondary | ICD-10-CM | POA: Diagnosis not present

## 2017-03-29 DIAGNOSIS — N812 Incomplete uterovaginal prolapse: Secondary | ICD-10-CM

## 2017-03-29 NOTE — Progress Notes (Signed)
Chief Complaint  Patient presents with  . Follow-up    pessary    Blood pressure (!) 150/98, pulse 60, weight 210 lb (95.3 kg).  83 y.o. No obstetric history on file. No LMP recorded. Patient has had a hysterectomy. The current method of family planning is status post hysterectomy.  Outpatient Encounter Prescriptions as of 03/29/2017  Medication Sig  . acetaminophen (TYLENOL) 325 MG tablet Take 650 mg by mouth every 6 (six) hours as needed.  . Azilsartan Medoxomil (EDARBI PO) Take 20 mg by mouth daily.  . cholecalciferol (VITAMIN D) 1000 units tablet Take 1,000 Units by mouth daily.  . Hydrocortisone (GERHARDT'S BUTT CREAM) CREA Apply 1 application topically 3 (three) times daily.  Marland Kitchen. OVER THE COUNTER MEDICATION 1 capsule daily.  . meclizine (ANTIVERT) 25 MG tablet Take 25 mg by mouth every 6 (six) hours as needed for dizziness.   No facility-administered encounter medications on file as of 03/29/2017.     Subjective Patient is seen as a follow-up from our visit from last week please find the details below: Pt is here for her routine pessary maintenance however she is having more rectal pressure and vaginal bleeding when she wipes  She did have a Gelhorn pessary at one time but I changed her over to a Milex ring #6 and that has worked well The discomfort has been going on for a few weeks is mild not exaserbated by other factors and there is no radiation  The patient states that her leg pain and vaginal bleeding and rectal discomfort have resolved with the pessary being out However she is having pressure from the prolapse that were sort [facility]  Objective On exam the pessary is fitting properly, there is no mucosal breakdown It is not putting undue pressure on the bladder or the rectum There is no BV noted I do not appreciate anything on exam that is different, it is fitting well2  I changed the patient over to a Milex ring with support  #5(3 inch) which is one size down to see if this a can give her enough support of her bladder and rectum and vaginal apex and the have her have less symptoms  Pertinent ROS No burning with urination, frequency or urgency No nausea, vomiting or diarrhea Nor fever chills or other constitutional symptoms   Labs or studies none    Impression Diagnoses this Encounter::   ICD-10-CM   1. Uterovaginal prolapse, incomplete N81.2   2. Pessary maintenance Z46.89     Established relevant diagnosis(es):   Plan/Recommendations: No orders of the defined types were placed in this encounter.   Labs or Scans Ordered: No orders of the defined types were placed in this encounter.   Management:: A new pessary is placed today Milex ring with support #5, 3 in  I'll leave it in for 2 weeks and see how she does and if it comes on the meantime she'll keep that she is also keeping her #6  Follow up Return in about 2 weeks (around 04/12/2017) for Follow up, with Dr Despina HiddenEure.    All questions were answered.  Past Medical History:  Diagnosis Date  . Hypertension     Past Surgical History:  Procedure Laterality Date  . benign tumor removed     removed from back and lateral right side  . COLONOSCOPY      OB History    No data available  Allergies  Allergen Reactions  . Asa [Aspirin]     Stomach irritation   . Penicillins Swelling    Social History   Social History  . Marital status: Single    Spouse name: N/A  . Number of children: N/A  . Years of education: N/A   Social History Main Topics  . Smoking status: Former Games developer  . Smokeless tobacco: Never Used  . Alcohol use No  . Drug use: No  . Sexual activity: Not Currently   Other Topics Concern  . None   Social History Narrative  . None    Family History  Problem Relation Age of Onset  . Cancer Sister        ovarian  . Cancer Sister        throat

## 2017-04-13 ENCOUNTER — Ambulatory Visit (INDEPENDENT_AMBULATORY_CARE_PROVIDER_SITE_OTHER): Payer: Medicare Other | Admitting: Obstetrics & Gynecology

## 2017-04-13 ENCOUNTER — Encounter: Payer: Self-pay | Admitting: Obstetrics & Gynecology

## 2017-04-13 VITALS — BP 140/86 | HR 89 | Wt 209.0 lb

## 2017-04-13 DIAGNOSIS — N993 Prolapse of vaginal vault after hysterectomy: Secondary | ICD-10-CM | POA: Diagnosis not present

## 2017-04-13 DIAGNOSIS — Z4689 Encounter for fitting and adjustment of other specified devices: Secondary | ICD-10-CM | POA: Diagnosis not present

## 2017-04-13 DIAGNOSIS — G2581 Restless legs syndrome: Secondary | ICD-10-CM

## 2017-04-13 MED ORDER — ROPINIROLE HCL 0.25 MG PO TABS: ORAL_TABLET | ORAL | 2 refills | Status: DC

## 2017-04-13 NOTE — Progress Notes (Signed)
Chief Complaint  Patient presents with  . Follow-up    pessary    Blood pressure 140/86, pulse 89, weight 209 lb (94.8 kg).  83 y.o. No obstetric history on file. No LMP recorded. Patient has had a hysterectomy. The current method of family planning is hysterectomy, post menopausal status.  Outpatient Encounter Prescriptions as of 04/13/2017  Medication Sig  . acetaminophen (TYLENOL) 325 MG tablet Take 650 mg by mouth every 6 (six) hours as needed.  . Azilsartan Medoxomil (EDARBI PO) Take 20 mg by mouth daily.  . cholecalciferol (VITAMIN D) 1000 units tablet Take 1,000 Units by mouth daily.  . Hydrocortisone (GERHARDT'S BUTT CREAM) CREA Apply 1 application topically 3 (three) times daily.  Marland Kitchen OVER THE COUNTER MEDICATION 1 capsule daily.  . meclizine (ANTIVERT) 25 MG tablet Take 25 mg by mouth every 6 (six) hours as needed for dizziness.  Marland Kitchen rOPINIRole (REQUIP) 0.25 MG tablet 1 tablet 1 hour before bedtime for 3 days, then 2 tablets if needed for 7 days, then 3 if needed   No facility-administered encounter medications on file as of 04/13/2017.     Subjective [name] [name] comes back in with the Milex ring with support #5 having fallen out We are having some difficulty getting her properly managed She has been on a Milex ring with support #6 for quite some time but was having more rectal pressure and just felt like that may be she should give was concerned about maybe trying another size which was actually my D ultimately an os put her down to a #5 however it has been unsuccessful She wants to return to the Milex ring with support #6  We briefly mentioned surgery but she certainly wants to avoid that if at all possible and I agree  Additionally she's been having problems once again with restless leg syndrome and I will start her on a course of Requip and see if that will help She understands it may take a while to dose it up and also for her possible side effects to  resolve   Objective General WDWN female NAD Vulva:  normal appearing vulva with no masses, tenderness or lesions Vagina:  Grade 2-3 cystocele grade 2-3 vaginal vault prolapse grade 2-3 rectocele Cervix:  absent Uterus:  uterus absent Adnexa: ovaries:,     Pertinent ROS Patient confirms today the following No burning with urination, frequency or urgency No nausea, vomiting or diarrhea Nor fever chills or other constitutional symptoms  She does report increase in her restless legs she had problems with the distant past  Labs or studies No new    Impression Diagnoses this Encounter::   ICD-10-CM   1. Vaginal vault prolapse after hysterectomy N99.3   2. Pessary maintenance Z46.89   3. Restless legs syndrome G25.81     Established relevant diagnosis(es):   Plan/Recommendations: Meds ordered this encounter  Medications  . rOPINIRole (REQUIP) 0.25 MG tablet    Sig: 1 tablet 1 hour before bedtime for 3 days, then 2 tablets if needed for 7 days, then 3 if needed    Dispense:  75 tablet    Refill:  2    Labs or Scans Ordered: No orders of the defined types were placed in this encounter.   Management:: We replaced her #5 pessary with a #6 and we'll see how this works for her we'll see her back in a month for follow-up regarding that Additionally I reviewed with Toriana dosing up of her Requip  for her restless leg syndrome and told her that she may have some adverse symptoms for the first week or so but generally that all resolves and hopefully that will be the case Has problems with either the meantime she will recontact me Otherwise, see her back in a month to check on the pessary  Follow up Return in about 1 month (around 05/13/2017) for Follow up, with Dr Despina HiddenEure.        Face to face time:  15 minutes  Greater than 50% of the visit time was spent in counseling and coordination of care with the patient.  The summary and outline of the counseling and care coordination  is summarized in the note above.   All questions were answered.  Past Medical History:  Diagnosis Date  . Fibromyalgia   . Hypertension   . Restless leg syndrome     Past Surgical History:  Procedure Laterality Date  . benign tumor removed     removed from back and lateral right side  . COLONOSCOPY      OB History    No data available      Allergies  Allergen Reactions  . Asa [Aspirin]     Stomach irritation   . Penicillins Swelling    Social History   Social History  . Marital status: Single    Spouse name: N/A  . Number of children: N/A  . Years of education: N/A   Social History Main Topics  . Smoking status: Former Games developermoker  . Smokeless tobacco: Never Used  . Alcohol use No  . Drug use: No  . Sexual activity: Not Currently   Other Topics Concern  . None   Social History Narrative  . None    Family History  Problem Relation Age of Onset  . Cancer Sister        ovarian  . Cancer Sister        throat

## 2017-05-11 ENCOUNTER — Encounter: Payer: Self-pay | Admitting: Obstetrics & Gynecology

## 2017-05-11 ENCOUNTER — Ambulatory Visit (INDEPENDENT_AMBULATORY_CARE_PROVIDER_SITE_OTHER): Payer: Medicare Other | Admitting: Obstetrics & Gynecology

## 2017-05-11 VITALS — BP 138/78 | HR 75 | Wt 213.0 lb

## 2017-05-11 DIAGNOSIS — Z4689 Encounter for fitting and adjustment of other specified devices: Secondary | ICD-10-CM | POA: Diagnosis not present

## 2017-05-11 DIAGNOSIS — N993 Prolapse of vaginal vault after hysterectomy: Secondary | ICD-10-CM

## 2017-05-11 NOTE — Progress Notes (Signed)
Chief Complaint  Patient presents with  . Follow-up    pessary    Blood pressure 138/78, pulse 75, weight 213 lb (96.6 kg).  [name] [name] presents today for routine follow up related to her pessary.   She uses a Milex ring with support #6.  She reports no vaginal discharge. Is having some spotting with wiping. Prefers this pessary from the #5 as this one would not stay in the vagina.   Exam reveals no undue vaginal mucosal pressure of breakdown, no discharge and no vaginal bleeding.  The pessary is removed, cleaned and replaced without difficulty.   Have also been working with patient's restless leg syndrome. She did not start Requip due to potential AE of suddenly falling asleep. She was thinking about using an OTC location anesthetic. Have recommended trial of tonic water at night.   [name] [name] will be seen back in 3 months for continued follow up.  Marcy Sirenatherine Wallace, D.O. 05/11/2017, 10:24 AM PGY-3, [facility]

## 2017-08-10 ENCOUNTER — Ambulatory Visit: Payer: Medicare Other | Admitting: Obstetrics & Gynecology

## 2017-08-24 ENCOUNTER — Encounter: Payer: Self-pay | Admitting: Obstetrics & Gynecology

## 2017-08-24 ENCOUNTER — Ambulatory Visit: Payer: Medicare Other | Admitting: Obstetrics & Gynecology

## 2017-08-24 VITALS — BP 148/90 | HR 72 | Ht 66.0 in | Wt 212.5 lb

## 2017-08-24 DIAGNOSIS — Z4689 Encounter for fitting and adjustment of other specified devices: Secondary | ICD-10-CM

## 2017-08-24 DIAGNOSIS — N812 Incomplete uterovaginal prolapse: Secondary | ICD-10-CM | POA: Diagnosis not present

## 2017-08-24 MED ORDER — METRONIDAZOLE 0.75 % VA GEL: 1.0000 | Freq: Two times a day (BID) | VAGINAL | 3 refills | Status: AC

## 2017-08-24 NOTE — Progress Notes (Signed)
  Chief Complaint  Patient presents with  . pessary maintenance    feels like pessary is out of place; requests refill on Metronidazole gel    [name] [name] comes in today for her routine maintenance visit related to her pessary She uses a Milex ring with support #6 She reports no vaginal discharge and no spotting previously we used a Milex ring with support #5 but was more problematic and staying in the vagina The patient thinks this 1 is working better  On exam she has normal external genitalia The pessary is removed without difficulty The vagina is without any mucosal breakdown discharge or bleeding The pessary is cleaned with warm water and soap The pessary is then replaced back into the vagina and seated properly without difficulty  We will see [name] [name] back as needed or in 4 months for routine follow-up  Past Medical History:  Diagnosis Date  . Fibromyalgia   . Hypertension   . Restless leg syndrome     Past Surgical History:  Procedure Laterality Date  . benign tumor removed     removed from back and lateral right side  . COLONOSCOPY      OB History    Gravida Para Term Preterm AB Living   3 3 2 1   3    SAB TAB Ectopic Multiple Live Births           3      Allergies  Allergen Reactions  . Asa [Aspirin]     Stomach irritation   . Penicillins Swelling    Social History   Socioeconomic History  . Marital status: Single    Spouse name: None  . Number of children: None  . Years of education: None  . Highest education level: None  Social Needs  . Financial resource strain: None  . Food insecurity - worry: None  . Food insecurity - inability: None  . Transportation needs - medical: None  . Transportation needs - non-medical: None  Occupational History  . None  Tobacco Use  . Smoking status: Former Smoker    Types: Cigarettes  . Smokeless tobacco: Never Used  Substance and Sexual Activity  . Alcohol use: No  . Drug use: No  . Sexual activity: Not  Currently    Birth control/protection: Surgical    Comment: hyst  Other Topics Concern  . None  Social History Narrative  . None    Family History  Problem Relation Age of Onset  . Cancer Sister        ovarian  . Cancer Sister        throat

## 2017-09-28 ENCOUNTER — Other Ambulatory Visit (HOSPITAL_COMMUNITY): Payer: Self-pay | Admitting: Internal Medicine

## 2017-09-28 DIAGNOSIS — Z1231 Encounter for screening mammogram for malignant neoplasm of breast: Secondary | ICD-10-CM

## 2017-10-04 ENCOUNTER — Ambulatory Visit (HOSPITAL_COMMUNITY)
Admission: RE | Admit: 2017-10-04 | Discharge: 2017-10-04 | Disposition: A | Discharge status: Home / Self Care | Payer: Medicare Other | Source: Ambulatory Visit | Attending: Internal Medicine | Admitting: Internal Medicine

## 2017-10-04 DIAGNOSIS — Z1231 Encounter for screening mammogram for malignant neoplasm of breast: Secondary | ICD-10-CM | POA: Insufficient documentation

## 2017-12-21 ENCOUNTER — Ambulatory Visit: Payer: Medicare Other | Admitting: Obstetrics & Gynecology

## 2017-12-27 ENCOUNTER — Encounter: Payer: Self-pay | Admitting: Obstetrics & Gynecology

## 2017-12-27 ENCOUNTER — Ambulatory Visit: Payer: Medicare Other | Admitting: Obstetrics & Gynecology

## 2017-12-27 VITALS — BP 140/80 | HR 72 | Ht 66.0 in | Wt 205.0 lb

## 2017-12-27 DIAGNOSIS — Z4689 Encounter for fitting and adjustment of other specified devices: Secondary | ICD-10-CM

## 2017-12-27 DIAGNOSIS — R21 Rash and other nonspecific skin eruption: Secondary | ICD-10-CM | POA: Diagnosis not present

## 2017-12-27 DIAGNOSIS — N3281 Overactive bladder: Secondary | ICD-10-CM | POA: Diagnosis not present

## 2017-12-27 MED ORDER — TRIAMCINOLONE ACETONIDE 0.1 % EX CREA: 1.0000 "application " | TOPICAL_CREAM | Freq: Two times a day (BID) | CUTANEOUS | 0 refills | Status: DC

## 2017-12-27 MED ORDER — SOLIFENACIN SUCCINATE 10 MG PO TABS
ORAL_TABLET | ORAL | 11 refills | Status: DC
Start: 2017-12-27 — End: 2018-07-24

## 2017-12-27 NOTE — Progress Notes (Signed)
Chief Complaint  Patient presents with  . Pessary Check      84 y.o. [id number] No LMP recorded. Patient has had a hysterectomy. The current method of family planning is hysterectomy  Outpatient Encounter Medications as of 12/27/2017  Medication Sig Note  . Azilsartan Medoxomil (EDARBI PO) Take 20 mg by mouth daily.   . cholecalciferol (VITAMIN D) 1000 units tablet Take 1,000 Units by mouth daily.   . furosemide (LASIX) 20 MG tablet Take 20 mg by mouth as needed.    . loratadine (CLARITIN) 10 MG tablet Take 10 mg by mouth daily.   . meclizine (ANTIVERT) 25 MG tablet Take 25 mg by mouth every 6 (six) hours as needed for dizziness.   . Naproxen Sodium (ALEVE PO) Take as needed by mouth.   . Hydrocortisone (GERHARDT'S BUTT CREAM) CREA Apply 1 application topically 3 (three) times daily. (Patient not taking: Reported on 12/27/2017) 08/24/2017: Needs refill  . metroNIDAZOLE (METROGEL) 0.75 % vaginal gel Place 1 Applicatorful 2 (two) times daily vaginally. (Patient not taking: Reported on 12/27/2017)   . solifenacin (VESICARE) 10 MG tablet 1 tablet at bedtime   . triamcinolone cream (KENALOG) 0.1 % Apply 1 application topically 2 (two) times daily.    No facility-administered encounter medications on file as of 12/27/2017.     Subjective [name] [name]  Appointment was made for ongoing pessary maintenance She uses a Milex ring with support number #6 and she is always been somewhat critical of it And all of her exam have increased the size 1 time in the past for better fit But otherwise I am is always worked well for her without any significant vaginal bleeding or discharge Her last visit 4 months ago she thought it was displaced and it was perfectly in place It is perfectly in place today  However she is also stating she is having urine loss which is been an increasing problem She states she loses urine all the time Does not require any provocative event Loses urine during the  night She does not take a diuretic Nothing seems to make it better or worse  She also has a rash in her left leg which occurred after she got an ultrasound of her left leg and she states it comes and goes since then It is itchy Not made worse by anything that she is aware of and has been going on now for several weeks  Past Medical History:  Diagnosis Date  . Fibromyalgia   . Hypertension   . Restless leg syndrome     Past Surgical History:  Procedure Laterality Date  . benign tumor removed     removed from back and lateral right side  . COLONOSCOPY      OB History    Gravida Para Term Preterm AB Living   3 3 2 1   3    SAB TAB Ectopic Multiple Live Births           3      Allergies  Allergen Reactions  . Asa [Aspirin]     Stomach irritation   . Penicillins Swelling    Social History   Socioeconomic History  . Marital status: Single    Spouse name: None  . Number of children: None  . Years of education: None  . Highest education level: None  Social Needs  . Financial resource strain: None  . Food insecurity - worry: None  . Food insecurity - inability: None  .  Transportation needs - medical: None  . Transportation needs - non-medical: None  Occupational History  . None  Tobacco Use  . Smoking status: Former Smoker    Types: Cigarettes  . Smokeless tobacco: Never Used  Substance and Sexual Activity  . Alcohol use: No  . Drug use: No  . Sexual activity: Not Currently    Birth control/protection: Surgical    Comment: hyst  Other Topics Concern  . None  Social History Narrative  . None    Family History  Problem Relation Age of Onset  . Cancer Sister        ovarian  . Cancer Sister        throat    Medications:       Current Outpatient Medications:  .  Azilsartan Medoxomil (EDARBI PO), Take 20 mg by mouth daily., Disp: , Rfl:  .  cholecalciferol (VITAMIN D) 1000 units tablet, Take 1,000 Units by mouth daily., Disp: , Rfl:  .  furosemide  (LASIX) 20 MG tablet, Take 20 mg by mouth as needed. , Disp: , Rfl:  .  loratadine (CLARITIN) 10 MG tablet, Take 10 mg by mouth daily., Disp: , Rfl:  .  meclizine (ANTIVERT) 25 MG tablet, Take 25 mg by mouth every 6 (six) hours as needed for dizziness., Disp: , Rfl:  .  Naproxen Sodium (ALEVE PO), Take as needed by mouth., Disp: , Rfl:  .  Hydrocortisone (GERHARDT'S BUTT CREAM) CREA, Apply 1 application topically 3 (three) times daily. (Patient not taking: Reported on 12/27/2017), Disp: 1 each, Rfl: 11 .  metroNIDAZOLE (METROGEL) 0.75 % vaginal gel, Place 1 Applicatorful 2 (two) times daily vaginally. (Patient not taking: Reported on 12/27/2017), Disp: 70 g, Rfl: 3 .  solifenacin (VESICARE) 10 MG tablet, 1 tablet at bedtime, Disp: 30 tablet, Rfl: 11 .  triamcinolone cream (KENALOG) 0.1 %, Apply 1 application topically 2 (two) times daily., Disp: 30 g, Rfl: 0  Objective Blood pressure 140/80, pulse 72, height 5\' 6"  (1.676 m), weight 205 lb (93 kg).  General WDWN female NAD Vulva:  normal appearing vulva with no masses, tenderness or lesions Vagina:  normal mucosa, no discharge Minimal discharge is noted and there is no bleeding the pessary is sitting properly with no vaginal mucosal breakdown Cervix:  absent Uterus:  uterus absent Adnexa: ovaries:,  negative   Pertinent ROS Per HPI  No burning with urination, frequency or urgency No nausea, vomiting or diarrhea Nor fever chills or other constitutional symptoms   Labs or studies No new    Impression Diagnoses this Encounter::   ICD-10-CM   1. OAB (overactive bladder) N32.81   2. Pessary maintenance Z46.89   3. Rash, skin, allergic R21    Left calf     Established relevant diagnosis(es):   Plan/Recommendations: Meds ordered this encounter  Medications  . solifenacin (VESICARE) 10 MG tablet    Sig: 1 tablet at bedtime    Dispense:  30 tablet    Refill:  11  . triamcinolone cream (KENALOG) 0.1 %    Sig: Apply 1  application topically 2 (two) times daily.    Dispense:  30 g    Refill:  0    Labs or Scans Ordered: No orders of the defined types were placed in this encounter.   Management:: The pessary is removed and cleaned with warm water and soap today and replaced without difficulty I will see her back in 4 months for ongoing pessary maintenance  She is begun in  Vesicare 10 mg to take at night to try to help with her overactive bladder symptoms  She is given a prescription for triamcinolone cream 0.1% to apply to her leg 2 times daily for itching and rash  If she has difficulties in the meantime she will give [place] a call otherwise we will see her back in 4 months  Follow up Return in about 4 months (around 04/28/2018) for Follow up, with Dr Despina Hidden.         All questions were answered.

## 2018-04-10 ENCOUNTER — Other Ambulatory Visit: Payer: Self-pay | Admitting: Pharmacist

## 2018-04-10 NOTE — Patient Outreach (Signed)
[facility] Care Management  04/10/2018  [name] [name] 05/26/1933 [id number]   Call to follow up with Dr. Scharlene GlossHall's office to confirm that [name] is to be taking Edarbi 20 mg once daily per Dr. Margo AyeHall and, if so, to request that Dr. Margo AyeHall send a new prescription to the patient's pharmacy to reflect this dosing. Leave a message with Amber in the office and request a call back.  Duanne MoronElisabeth Brack Shaddock, PharmD, [facility] Clinical Pharmacist [facility] Care Management [phone]

## 2018-04-10 NOTE — Patient Outreach (Signed)
[facility] Care Management  04/10/2018  [name] [name] 09/30/1933 [id number]   Incoming call from [name] [name] in response to the [facility] Medication Adherence Campaign. Speak with patient. HIPAA identifiers verified and verbal consent received.  [name] [name] reports that she is taking Edarbi 20 mg once daily as directed. Note that per the Medication Dispense report in Epic, patient's prescription is written for Edarbi 40 mg for patient to receive 30 tablets for a 30 day supply. [name] [name] reports that over 6 months ago, Dr. Margo AyeHall instructed her to take just 1/2 tablet (20 mg) daily as her blood pressure had been running low.   [name] [name] reports that the [name] [name] has been expensive, but that she is able to afford it. Denies affordability as a barrier to her taking the medication. Reports that she has been on many similar blood pressure medications and had side effects with all. Denies having these side effects with Edarbi. Let patient know about the patient assistance program through the manufacturer that is available for this medication. [name] [name] reports that she meets the income requirement for this program and appreciates the information, but that she is not interested in applying for the program at this time.   [name] [name] reports that she just saw Dr. Margo AyeHall for an office visit this past Friday and was started on gabapentin for pain. Note that patient was started on gabapentin 100 mg once daily. Reports that she has been taking it at bedtime and that at this time she has not noticed an improvement in her pain symptoms. Counsel patient about potential side effects and patient reports that she has noticed drowsiness with the medication. Counsel patient to use caution when taking the medication for that reason. [name] [name] reports that, as instructed by her PCP, she will take the medication for 4 weeks and then have another office visit with him to follow up.  [name] [name] denies any further  medication questions/concerns at this time. Offer patient my phone number, but she declines.   PLAN  1) Will call to follow up with Dr. Scharlene GlossHall's office to confirm that [name] [name] is to be taking Edarbi 20 mg once daily per Dr. Margo AyeHall and, if so, to request that Dr. Margo AyeHall send a new prescription to the patient's pharmacy to reflect this dosing.  2) Will plan to close pharmacy episode after hearing back from Dr. Margo AyeHall.  [name] [name], PharmD, [facility]ACP Clinical Pharmacist [facility] Care Management [phone]

## 2018-04-15 ENCOUNTER — Other Ambulatory Visit: Payer: Self-pay | Admitting: Pharmacist

## 2018-04-15 NOTE — Patient Outreach (Signed)
[facility] Care Management  04/15/2018  [name] [name] 11/22/1932 [id number]   Call again to confirm that [name] is to be taking Edarbi 20 mg once daily per Dr. Margo AyeHall and, if so, to request that Dr. Margo AyeHall send a new prescription to the patient's pharmacy to reflect this dosing. Morrie Sheldonshley reports that Dr. Margo AyeHall received my message, confirmed this dosing and sent a new prescription for the Edarbi, reflecting this dosing, into the patient's pharmacy on 04/10/18.  Will close pharmacy episode at this time.  Duanne MoronElisabeth Ilias Stcharles, PharmD, [facility]BCACP Clinical Pharmacist [facility] Care Management [phone]

## 2018-04-26 ENCOUNTER — Ambulatory Visit: Payer: Medicare Other | Admitting: Obstetrics & Gynecology

## 2018-04-26 ENCOUNTER — Encounter: Payer: Self-pay | Admitting: Obstetrics & Gynecology

## 2018-04-26 ENCOUNTER — Other Ambulatory Visit: Payer: Self-pay

## 2018-04-26 DIAGNOSIS — N3281 Overactive bladder: Secondary | ICD-10-CM | POA: Diagnosis not present

## 2018-04-26 DIAGNOSIS — N993 Prolapse of vaginal vault after hysterectomy: Secondary | ICD-10-CM

## 2018-04-26 DIAGNOSIS — Z4689 Encounter for fitting and adjustment of other specified devices: Secondary | ICD-10-CM

## 2018-04-26 NOTE — Progress Notes (Signed)
Chief Complaint  Patient presents with  . pessary maintenance    There were no vitals taken for this visit.  [name] [name] presents today for routine follow up related to her pessary.   She uses a milex ring with support #6 She reports no vaginal discharge or vaginal bleeding.  Exam reveals no undue vaginal mucosal pressure of breakdown, no discharge and no vaginal bleeding.  The pessary is removed, cleaned and replaced without difficulty.    [name] [name] will be sen back in 4 months for continued follow up.  [name] ArmsLuther H Kyaira Trantham, MD  04/26/2018 10:18 AM

## 2018-07-24 ENCOUNTER — Encounter: Payer: Self-pay | Admitting: Cardiology

## 2018-07-24 ENCOUNTER — Ambulatory Visit: Payer: Medicare Other | Admitting: Cardiology

## 2018-07-24 VITALS — BP 140/87 | HR 80 | Ht 67.0 in | Wt 208.4 lb

## 2018-07-24 DIAGNOSIS — R002 Palpitations: Secondary | ICD-10-CM

## 2018-07-24 DIAGNOSIS — I10 Essential (primary) hypertension: Secondary | ICD-10-CM

## 2018-07-24 DIAGNOSIS — R011 Cardiac murmur, unspecified: Secondary | ICD-10-CM | POA: Diagnosis not present

## 2018-07-24 NOTE — Patient Instructions (Signed)
Medication Instructions:  Your physician recommends that you continue on your current medications as directed. Please refer to the Current Medication list given to you today.   Labwork: NONE  Testing/Procedures: NONE  Follow-Up: Your physician recommends that you schedule a follow-up appointment in: AS NEEDED    Any Other Special Instructions Will Be Listed Below (If Applicable).     If you need a refill on your cardiac medications before your next appointment, please call your pharmacy.  AV

## 2018-07-24 NOTE — Progress Notes (Signed)
Clinical Summary Ms. Soulier is a 84 y.o.female seen as new consult  1. Palpitations - recently started on bystolic by pcp - she reproted symptoms with gabapentin of chest pains, blurred vision, high bp's, and palpitations - stopped taking gabapentin and symptoms resolved.  - reports similar symptoms after pcp started bystolic, resolved off.  - 1 cup of coffee daily, no sodas, no tea, no EtOH   2. HTN - home bp's daily 110s/80s. Pcp stopped bystolic recently due to side effects, edarbi was increased to 40mg     3. Fibromyalgia  Past Medical History:  Diagnosis Date  . Fibromyalgia   . Hypertension   . Restless leg syndrome      Allergies  Allergen Reactions  . Asa [Aspirin]     Stomach irritation   . Penicillins Swelling     Current Outpatient Medications  Medication Sig Dispense Refill  . acetaminophen (TYLENOL) 500 MG tablet Take 1,000 mg by mouth daily as needed.    . Azilsartan Medoxomil (EDARBI PO) Take 20 mg by mouth daily.    . cholecalciferol (VITAMIN D) 1000 units tablet Take 1,000 Units by mouth daily.    . furosemide (LASIX) 20 MG tablet Take 20 mg by mouth as needed.     . gabapentin (NEURONTIN) 100 MG capsule     . Hydrocortisone (GERHARDT'S BUTT CREAM) CREA Apply 1 application topically 3 (three) times daily. 1 each 11  . loratadine (CLARITIN) 10 MG tablet Take 10 mg by mouth daily.    . meclizine (ANTIVERT) 25 MG tablet Take 25 mg by mouth every 6 (six) hours as needed for dizziness.    . metroNIDAZOLE (METROGEL) 0.75 % vaginal gel Place 1 Applicatorful 2 (two) times daily vaginally. (Patient not taking: Reported on 12/27/2017) 70 g 3  . Naproxen Sodium (ALEVE PO) Take as needed by mouth.    . solifenacin (VESICARE) 10 MG tablet 1 tablet at bedtime (Patient not taking: Reported on 04/26/2018) 30 tablet 11  . triamcinolone cream (KENALOG) 0.1 % Apply 1 application topically 2 (two) times daily. 30 g 0   No current facility-administered medications  for this visit.      Past Surgical History:  Procedure Laterality Date  . benign tumor removed     removed from back and lateral right side  . COLONOSCOPY       Allergies  Allergen Reactions  . Asa [Aspirin]     Stomach irritation   . Penicillins Swelling      Family History  Problem Relation Age of Onset  . Cancer Sister        ovarian  . Cancer Sister        throat     Social History Ms. Sookdeo reports that she has quit smoking. Her smoking use included cigarettes. She has never used smokeless tobacco. Ms. Wissinger reports that she does not drink alcohol.   Review of Systems CONSTITUTIONAL: No weight loss, fever, chills, weakness or fatigue.  HEENT: Eyes: No visual loss, blurred vision, double vision or yellow sclerae.No hearing loss, sneezing, congestion, runny nose or sore throat.  SKIN: No rash or itching.  CARDIOVASCULAR: per hpi RESPIRATORY: No shortness of breath, cough or sputum.  GASTROINTESTINAL: No anorexia, nausea, vomiting or diarrhea. No abdominal pain or blood.  GENITOURINARY: No burning on urination, no polyuria NEUROLOGICAL: No headache, dizziness, syncope, paralysis, ataxia, numbness or tingling in the extremities. No change in bowel or bladder control.  MUSCULOSKELETAL: No muscle, back pain, joint pain or  stiffness.  LYMPHATICS: No enlarged nodes. No history of splenectomy.  PSYCHIATRIC: No history of depression or anxiety.  ENDOCRINOLOGIC: No reports of sweating, cold or heat intolerance. No polyuria or polydipsia.  Marland Kitchen   Physical Examination Vitals:   07/24/18 0918  BP: 140/87  Pulse: 80  SpO2: 98%   Vitals:   07/24/18 0918  Weight: 208 lb 6.4 oz (94.5 kg)  Height: 5\' 7"  (1.702 m)    Gen: resting comfortably, no acute distress HEENT: no scleral icterus, pupils equal round and reactive, no palptable cervical adenopathy,  CV: RRR, 3/6 systolic murmur rusb, no jvd Resp: Clear to auscultation bilaterally GI: abdomen is soft, non-tender,  non-distended, normal bowel sounds, no hepatosplenomegaly MSK: extremities are warm, no edema.  Skin: warm, no rash Neuro:  no focal deficits Psych: appropriate affect     Assessment and Plan  1. Palpitations - symptoms have completely resolved off gabapentin and bystolic - monitor at this time, if recurrence she is to contact us to arrange a cardiac monitor - EKG today shows normal sinus rhythm  2. HTN - elevated in clinic, home numbers at goal. Continue current meds  3. Heart murmur - AS murmur on exam. We discussed in detail the etiology of heart murmurs and how valvular heart disease can affect heart function. I recommended an echo however she has elected not to have done at this time.  F/u as needed    Antoine Poche, M.D.

## 2018-08-28 ENCOUNTER — Encounter: Payer: Self-pay | Admitting: Obstetrics & Gynecology

## 2018-08-28 ENCOUNTER — Ambulatory Visit: Payer: Medicare Other | Admitting: Obstetrics & Gynecology

## 2018-08-28 ENCOUNTER — Other Ambulatory Visit: Payer: Self-pay

## 2018-08-28 ENCOUNTER — Encounter (INDEPENDENT_AMBULATORY_CARE_PROVIDER_SITE_OTHER): Payer: Self-pay

## 2018-08-28 VITALS — BP 142/74 | HR 89 | Ht 66.0 in | Wt 205.0 lb

## 2018-08-28 DIAGNOSIS — Z4689 Encounter for fitting and adjustment of other specified devices: Secondary | ICD-10-CM | POA: Diagnosis not present

## 2018-08-28 DIAGNOSIS — N993 Prolapse of vaginal vault after hysterectomy: Secondary | ICD-10-CM | POA: Diagnosis not present

## 2018-08-28 MED ORDER — POLYETHYLENE GLYCOL 3350 17 GM/SCOOP PO POWD: ORAL | 11 refills | Status: DC

## 2018-08-28 NOTE — Progress Notes (Signed)
Chief Complaint  Patient presents with  . pessary maintenance    Blood pressure (!) 142/74, pulse 89, height 5\' 6"  (1.676 m), weight 205 lb (93 kg).  [name] [name] presents today for routine follow up related to her pessary.   She uses a Milex ring with support #6 She reports no vaginal discharge or vaginal bleeding.  Exam reveals no undue vaginal mucosal pressure of breakdown, no discharge and no vaginal bleeding.  The pessary is removed, cleaned and replaced without difficulty.    [name] [name] will be sen back in 4 months for continued follow up.  Meds ordered this encounter  Medications  . polyethylene glycol powder (GLYCOLAX/MIRALAX) powder    Sig: 1 scoop daily or as needed    Dispense:  255 g    Refill:  11     Lazaro ArmsLuther H Yevonne Yokum, MD  08/28/2018 2:35 PM

## 2018-09-30 ENCOUNTER — Other Ambulatory Visit (HOSPITAL_COMMUNITY): Payer: Self-pay | Admitting: Internal Medicine

## 2018-09-30 DIAGNOSIS — Z1231 Encounter for screening mammogram for malignant neoplasm of breast: Secondary | ICD-10-CM

## 2018-10-23 ENCOUNTER — Encounter (HOSPITAL_COMMUNITY): Payer: Self-pay

## 2018-10-23 ENCOUNTER — Ambulatory Visit (HOSPITAL_COMMUNITY)
Admission: RE | Admit: 2018-10-23 | Discharge: 2018-10-23 | Disposition: A | Discharge status: Home / Self Care | Payer: Medicare Other | Source: Ambulatory Visit | Attending: Internal Medicine | Admitting: Internal Medicine

## 2018-10-23 DIAGNOSIS — Z1231 Encounter for screening mammogram for malignant neoplasm of breast: Secondary | ICD-10-CM | POA: Insufficient documentation

## 2018-12-25 ENCOUNTER — Ambulatory Visit: Payer: Medicare Other | Admitting: Obstetrics & Gynecology

## 2018-12-25 ENCOUNTER — Encounter: Payer: Self-pay | Admitting: Obstetrics & Gynecology

## 2018-12-25 ENCOUNTER — Other Ambulatory Visit: Payer: Self-pay

## 2018-12-25 VITALS — BP 135/85 | HR 94 | Ht 66.0 in | Wt 200.0 lb

## 2018-12-25 DIAGNOSIS — Z4689 Encounter for fitting and adjustment of other specified devices: Secondary | ICD-10-CM | POA: Diagnosis not present

## 2018-12-25 DIAGNOSIS — N3281 Overactive bladder: Secondary | ICD-10-CM

## 2018-12-25 DIAGNOSIS — N993 Prolapse of vaginal vault after hysterectomy: Secondary | ICD-10-CM

## 2018-12-25 MED ORDER — POLYETHYLENE GLYCOL 3350 17 GM/SCOOP PO POWD: ORAL | 11 refills | Status: DC

## 2018-12-25 NOTE — Progress Notes (Signed)
Chief Complaint  Patient presents with  . Pessary Check    Blood pressure 135/85, pulse 94, height 5\' 6"  (1.676 m), weight 200 lb (90.7 kg).  [name] [name] presents today for routine follow up related to her pessary.   She uses a Milex ring with support #6 She has 2-3 days of some vaginal bleeding just about every month which we have had trouble with before It seems we can't quite get the right fit for her Ideally she would use a Gelhorm but I refuse to use those because of the significant mucosal pressure they cause  She reports no vaginal discharge or vaginal bleeding.  Exam reveals no undue vaginal mucosal pressure of breakdown, no discharge and no vaginal bleeding.  The pessary is removed, cleaned and replaced without difficulty.   However I will see her back in 2 weeks and put the #5 in that she has at home  Meds ordered this encounter  Medications  . polyethylene glycol powder (GLYCOLAX/MIRALAX) powder    Sig: 1 scoop daily or as needed    Dispense:  255 g    Refill:  11     [name] [name] will be sen back in 2 weeks for continued follow up.  Lazaro Arms, MD  12/25/2018 2:52 PM

## 2019-01-08 ENCOUNTER — Encounter: Payer: Medicare Other | Admitting: Obstetrics & Gynecology

## 2019-02-06 ENCOUNTER — Telehealth: Payer: Self-pay | Admitting: Obstetrics & Gynecology

## 2019-02-06 NOTE — Telephone Encounter (Signed)
She can give it a try , she doesn't really need to use gloves she can if she likes

## 2019-02-06 NOTE — Telephone Encounter (Signed)
Patient called, rescheduled her appt for her pessary til June due to covid-19, she does not want to get out.  She stated that it's bothering her.  She wants to know if she can wear latex gloves and take it out herself.  She stated she thinks it's on nerve and causing pain in her legs.  [phone]

## 2019-02-06 NOTE — Telephone Encounter (Signed)
Patient informed that she can remove it.

## 2019-02-07 ENCOUNTER — Encounter: Payer: Medicare Other | Admitting: Obstetrics & Gynecology

## 2019-03-05 ENCOUNTER — Encounter: Payer: Self-pay | Admitting: Orthopedic Surgery

## 2019-03-05 ENCOUNTER — Ambulatory Visit: Payer: Self-pay | Admitting: Orthopedic Surgery

## 2019-03-27 ENCOUNTER — Telehealth: Payer: Self-pay | Admitting: *Deleted

## 2019-03-27 NOTE — Telephone Encounter (Signed)

## 2019-03-28 ENCOUNTER — Ambulatory Visit: Payer: Medicare Other | Admitting: Obstetrics & Gynecology

## 2019-03-28 ENCOUNTER — Other Ambulatory Visit: Payer: Self-pay

## 2019-03-28 ENCOUNTER — Encounter: Payer: Self-pay | Admitting: Obstetrics & Gynecology

## 2019-03-28 VITALS — BP 138/68 | HR 75 | Ht 66.0 in | Wt 200.0 lb

## 2019-03-28 DIAGNOSIS — N3281 Overactive bladder: Secondary | ICD-10-CM

## 2019-03-28 DIAGNOSIS — N993 Prolapse of vaginal vault after hysterectomy: Secondary | ICD-10-CM | POA: Diagnosis not present

## 2019-03-28 DIAGNOSIS — Z4689 Encounter for fitting and adjustment of other specified devices: Secondary | ICD-10-CM

## 2019-03-28 NOTE — Progress Notes (Signed)
Chief Complaint  Patient presents with  . Follow-up    wants pessary inserted    Blood pressure 138/68, pulse 75, height 5\' 6"  (1.676 m), weight 200 lb (90.7 kg).  [name] [name] presents today for routine follow up related to her pessary.   She uses a Milex ring with support #5 She reports no vaginal discharge or vaginal bleeding.  Exam reveals no undue vaginal mucosal pressure of breakdown, no discharge and no vaginal bleeding.  The pessary is removed, cleaned and replaced without difficulty.    [name] [name] will be sen back in 4 months for continued follow up.  Florian Buff, MD  03/28/2019 9:52 AM

## 2019-07-31 ENCOUNTER — Telehealth: Payer: Self-pay | Admitting: Obstetrics & Gynecology

## 2019-07-31 NOTE — Telephone Encounter (Signed)

## 2019-08-01 ENCOUNTER — Ambulatory Visit (INDEPENDENT_AMBULATORY_CARE_PROVIDER_SITE_OTHER): Payer: Medicare Other | Admitting: Obstetrics & Gynecology

## 2019-08-01 ENCOUNTER — Encounter: Payer: Self-pay | Admitting: Obstetrics & Gynecology

## 2019-08-01 ENCOUNTER — Other Ambulatory Visit: Payer: Self-pay

## 2019-08-01 VITALS — BP 127/75 | Ht 67.0 in | Wt 202.0 lb

## 2019-08-01 DIAGNOSIS — Z4689 Encounter for fitting and adjustment of other specified devices: Secondary | ICD-10-CM | POA: Diagnosis not present

## 2019-08-01 DIAGNOSIS — N993 Prolapse of vaginal vault after hysterectomy: Secondary | ICD-10-CM

## 2019-08-01 NOTE — Progress Notes (Signed)
Chief Complaint  Patient presents with  . Pessary Check    Blood pressure 127/75, height 5\' 7"  (1.702 m), weight 202 lb (91.6 kg).  [name] [name] presents today for routine follow up related to her pessary.   She uses a Milex ring with support #5 She reports no vaginal discharge or vaginal bleeding.  Exam reveals no undue vaginal mucosal pressure of breakdown, no discharge and no vaginal bleeding.  The pessary is removed, cleaned and replaced without difficulty.    [name] [name] will be sen back in 4 months for continued follow up.  Florian Buff, MD  08/01/2019 10:43 AM

## 2019-10-13 IMAGING — MG DIGITAL SCREENING BILATERAL MAMMOGRAM WITH TOMO AND CAD
1 series · 1 of 1 positions shown · non-contrast
Comparison: Previous exam(s).

CLINICAL DATA: Screening.

EXAM:
DIGITAL SCREENING BILATERAL MAMMOGRAM WITH TOMO AND CAD

[R MLO synth-2D]
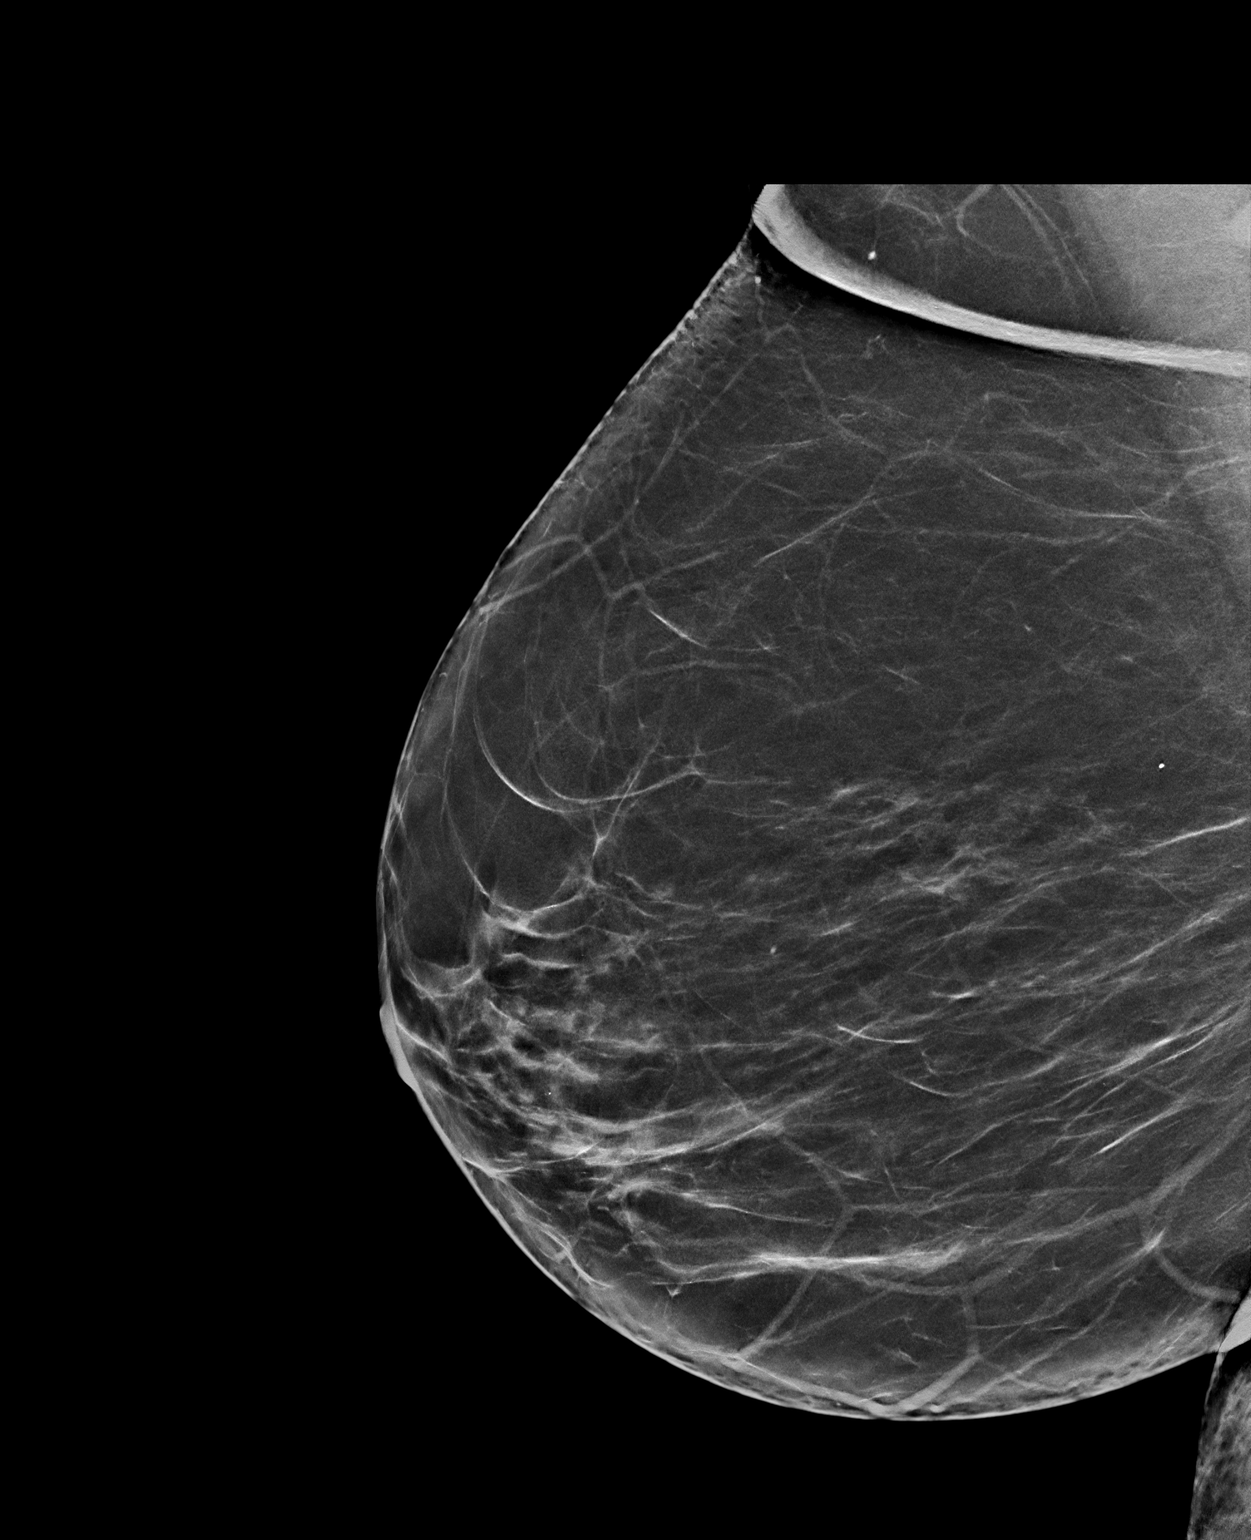

[1 of 1 positions shown; findings below may reference images not displayed]

ACR Breast Density Category b: There are scattered areas of
fibroglandular density.
FINDINGS: There are no findings suspicious for malignancy. Images were
processed with CAD.
IMPRESSION: No mammographic evidence of malignancy. A result letter of this
screening mammogram will be mailed directly to the patient.

RECOMMENDATION:
Screening mammogram in one year. (Code:[id number])

BI-RADS CATEGORY  1: Negative.

## 2019-10-28 DIAGNOSIS — R32 Unspecified urinary incontinence: Secondary | ICD-10-CM | POA: Diagnosis not present

## 2019-10-28 DIAGNOSIS — N1832 Chronic kidney disease, stage 3b: Secondary | ICD-10-CM | POA: Diagnosis not present

## 2019-10-28 DIAGNOSIS — I10 Essential (primary) hypertension: Secondary | ICD-10-CM | POA: Diagnosis not present

## 2019-10-28 DIAGNOSIS — I2510 Atherosclerotic heart disease of native coronary artery without angina pectoris: Secondary | ICD-10-CM | POA: Diagnosis not present

## 2019-10-28 DIAGNOSIS — G2581 Restless legs syndrome: Secondary | ICD-10-CM | POA: Diagnosis not present

## 2019-10-28 DIAGNOSIS — Z0000 Encounter for general adult medical examination without abnormal findings: Secondary | ICD-10-CM | POA: Diagnosis not present

## 2019-10-28 DIAGNOSIS — I739 Peripheral vascular disease, unspecified: Secondary | ICD-10-CM | POA: Diagnosis not present

## 2019-10-28 DIAGNOSIS — Z0001 Encounter for general adult medical examination with abnormal findings: Secondary | ICD-10-CM | POA: Diagnosis not present

## 2019-10-28 DIAGNOSIS — E782 Mixed hyperlipidemia: Secondary | ICD-10-CM | POA: Diagnosis not present

## 2019-10-28 DIAGNOSIS — D509 Iron deficiency anemia, unspecified: Secondary | ICD-10-CM | POA: Diagnosis not present

## 2019-10-28 DIAGNOSIS — N8111 Cystocele, midline: Secondary | ICD-10-CM | POA: Diagnosis not present

## 2019-10-28 DIAGNOSIS — D649 Anemia, unspecified: Secondary | ICD-10-CM | POA: Diagnosis not present

## 2019-10-28 DIAGNOSIS — R7301 Impaired fasting glucose: Secondary | ICD-10-CM | POA: Diagnosis not present

## 2019-12-02 ENCOUNTER — Ambulatory Visit: Payer: Medicare Other | Admitting: Obstetrics & Gynecology

## 2019-12-05 ENCOUNTER — Ambulatory Visit: Payer: Medicare Other | Admitting: Obstetrics & Gynecology

## 2019-12-15 ENCOUNTER — Telehealth: Payer: Self-pay | Admitting: Women's Health

## 2019-12-15 NOTE — Telephone Encounter (Signed)

## 2019-12-16 ENCOUNTER — Other Ambulatory Visit: Payer: Self-pay

## 2019-12-16 ENCOUNTER — Encounter: Payer: Self-pay | Admitting: Obstetrics & Gynecology

## 2019-12-16 ENCOUNTER — Ambulatory Visit: Payer: Medicare PPO | Admitting: Obstetrics & Gynecology

## 2019-12-16 VITALS — BP 137/74 | HR 77 | Ht 67.0 in | Wt 197.0 lb

## 2019-12-16 DIAGNOSIS — N3281 Overactive bladder: Secondary | ICD-10-CM

## 2019-12-16 DIAGNOSIS — Z4689 Encounter for fitting and adjustment of other specified devices: Secondary | ICD-10-CM

## 2019-12-16 DIAGNOSIS — N939 Abnormal uterine and vaginal bleeding, unspecified: Secondary | ICD-10-CM | POA: Diagnosis not present

## 2019-12-16 DIAGNOSIS — N993 Prolapse of vaginal vault after hysterectomy: Secondary | ICD-10-CM

## 2019-12-16 NOTE — Progress Notes (Signed)
Chief Complaint  Patient presents with  . Pessary Check    Blood pressure 137/74, pulse 77, height 5\' 7"  (1.702 m), weight 197 lb (89.4 kg).  [name] [name] presents today for routine follow up related to her pessary.   She uses a Milex ring with support #5 She reports no vaginal discharge but chronically has some  vaginal bleeding.  Exam reveals no undue vaginal mucosal pressure of breakdown, no discharge and moderate vaginal bleeding.  The pessary is removed, cleaned and replaced without difficulty.    [name] [name] will be sen back in 4 months for continued follow up.  Irish Lack, MD  12/16/2019 3:13 PM

## 2020-02-24 DIAGNOSIS — E669 Obesity, unspecified: Secondary | ICD-10-CM | POA: Diagnosis not present

## 2020-02-24 DIAGNOSIS — D649 Anemia, unspecified: Secondary | ICD-10-CM | POA: Diagnosis not present

## 2020-02-24 DIAGNOSIS — D179 Benign lipomatous neoplasm, unspecified: Secondary | ICD-10-CM | POA: Diagnosis not present

## 2020-02-24 DIAGNOSIS — E782 Mixed hyperlipidemia: Secondary | ICD-10-CM | POA: Diagnosis not present

## 2020-02-24 DIAGNOSIS — D509 Iron deficiency anemia, unspecified: Secondary | ICD-10-CM | POA: Diagnosis not present

## 2020-02-24 DIAGNOSIS — E6609 Other obesity due to excess calories: Secondary | ICD-10-CM | POA: Diagnosis not present

## 2020-02-24 DIAGNOSIS — E559 Vitamin D deficiency, unspecified: Secondary | ICD-10-CM | POA: Diagnosis not present

## 2020-02-24 DIAGNOSIS — Z0001 Encounter for general adult medical examination with abnormal findings: Secondary | ICD-10-CM | POA: Diagnosis not present

## 2020-02-24 DIAGNOSIS — Z0000 Encounter for general adult medical examination without abnormal findings: Secondary | ICD-10-CM | POA: Diagnosis not present

## 2020-03-02 DIAGNOSIS — N1832 Chronic kidney disease, stage 3b: Secondary | ICD-10-CM | POA: Diagnosis not present

## 2020-03-02 DIAGNOSIS — I10 Essential (primary) hypertension: Secondary | ICD-10-CM | POA: Diagnosis not present

## 2020-03-02 DIAGNOSIS — E782 Mixed hyperlipidemia: Secondary | ICD-10-CM | POA: Diagnosis not present

## 2020-03-02 DIAGNOSIS — R7301 Impaired fasting glucose: Secondary | ICD-10-CM | POA: Diagnosis not present

## 2020-03-02 DIAGNOSIS — M5410 Radiculopathy, site unspecified: Secondary | ICD-10-CM | POA: Diagnosis not present

## 2020-03-02 DIAGNOSIS — N8110 Cystocele, unspecified: Secondary | ICD-10-CM | POA: Diagnosis not present

## 2020-03-02 DIAGNOSIS — E559 Vitamin D deficiency, unspecified: Secondary | ICD-10-CM | POA: Diagnosis not present

## 2020-03-02 DIAGNOSIS — I739 Peripheral vascular disease, unspecified: Secondary | ICD-10-CM | POA: Diagnosis not present

## 2020-03-02 DIAGNOSIS — D649 Anemia, unspecified: Secondary | ICD-10-CM | POA: Diagnosis not present

## 2020-04-27 ENCOUNTER — Encounter: Payer: Self-pay | Admitting: Adult Health

## 2020-04-27 ENCOUNTER — Ambulatory Visit: Payer: Medicare PPO | Admitting: Adult Health

## 2020-04-27 VITALS — BP 176/83 | HR 77 | Ht 66.0 in | Wt 204.5 lb

## 2020-04-27 DIAGNOSIS — Z4689 Encounter for fitting and adjustment of other specified devices: Secondary | ICD-10-CM

## 2020-04-27 DIAGNOSIS — N993 Prolapse of vaginal vault after hysterectomy: Secondary | ICD-10-CM | POA: Diagnosis not present

## 2020-04-27 NOTE — Progress Notes (Signed)
  Subjective:     Patient ID: [name] [name], female   DOB: 12/13/1932, 86 y.o.   MRN: [id number]  HPI Jillianna is a 86 year old black female, single now, sp hysterectomy in for pessary maintenance. She says it is not in place and has noticed some blood and is hard to pee or have BM if pessary really crooked. PCP is Dr Margo Aye.  Review of Systems  Denies any pain  She is not sexually active See HPI for positives.  Reviewed past medical,surgical, social and family history. Reviewed medications and allergies.     Objective:   Physical Exam BP (!) 176/83 (BP Location: Left Arm, Patient Position: Sitting, Cuff Size: Large)   Pulse 77   Ht 5\' 6"  (1.676 m)   Wt 204 lb 8 oz (92.8 kg)   BMI 33.01 kg/m    Skin warm and dry.Pelvic: external genitalia is normal in appearance no lesions, vagina: pessary removed and cleaned, tan discharge without odor,some blood,+vaginal vault prolapse,Dr in to check pessary placement,urethra has no lesions or masses noted, cervix and uterus are absent, adnexa: no masses or tenderness noted. Bladder is non tender and no masses felt. Examination chaperoned by Despina Hidden CMA. Dr Federico Flake explains to pt, that the milex #5 ring is her best choice for now, that the [facility] gave her more pressure and problems with urination and BMs.  Assessment:     1. Pessary maintenance, Milex ring with support #5 Use metrogel prn Push pessary back if wants to come out  2. Vaginal vault prolapse after hysterectomy     Plan:     Follow up in 4 months or sooner if needed for pessary maintenance

## 2020-08-27 ENCOUNTER — Encounter: Payer: Self-pay | Admitting: Obstetrics & Gynecology

## 2020-08-27 ENCOUNTER — Ambulatory Visit: Payer: Medicare PPO | Admitting: Obstetrics & Gynecology

## 2020-08-27 VITALS — BP 149/87 | HR 76 | Wt 196.0 lb

## 2020-08-27 DIAGNOSIS — N993 Prolapse of vaginal vault after hysterectomy: Secondary | ICD-10-CM | POA: Diagnosis not present

## 2020-08-27 DIAGNOSIS — Z4689 Encounter for fitting and adjustment of other specified devices: Secondary | ICD-10-CM

## 2020-08-27 NOTE — Progress Notes (Signed)
Chief Complaint  Patient presents with  . Pessary Check    Blood pressure (!) 149/87, pulse 76, weight 196 lb (88.9 kg).  [name] [name] presents today for routine follow up related to her pessary.   She uses a Milex ring with support #5 She reports no vaginal discharge or vaginal bleeding.  Exam reveals no undue vaginal mucosal pressure of breakdown, no discharge and no vaginal bleeding.  The pessary is removed, cleaned and replaced without difficulty.    [name] will be sen back in 4 months for continued follow up.  Lazaro Arms, MD  08/27/2020 11:08 AM

## 2020-08-31 DIAGNOSIS — Z0001 Encounter for general adult medical examination with abnormal findings: Secondary | ICD-10-CM | POA: Diagnosis not present

## 2020-08-31 DIAGNOSIS — E782 Mixed hyperlipidemia: Secondary | ICD-10-CM | POA: Diagnosis not present

## 2020-08-31 DIAGNOSIS — R32 Unspecified urinary incontinence: Secondary | ICD-10-CM | POA: Diagnosis not present

## 2020-08-31 DIAGNOSIS — D509 Iron deficiency anemia, unspecified: Secondary | ICD-10-CM | POA: Diagnosis not present

## 2020-08-31 DIAGNOSIS — R7301 Impaired fasting glucose: Secondary | ICD-10-CM | POA: Diagnosis not present

## 2020-08-31 DIAGNOSIS — Z0000 Encounter for general adult medical examination without abnormal findings: Secondary | ICD-10-CM | POA: Diagnosis not present

## 2020-08-31 DIAGNOSIS — I2510 Atherosclerotic heart disease of native coronary artery without angina pectoris: Secondary | ICD-10-CM | POA: Diagnosis not present

## 2020-08-31 DIAGNOSIS — N8111 Cystocele, midline: Secondary | ICD-10-CM | POA: Diagnosis not present

## 2020-08-31 DIAGNOSIS — G2581 Restless legs syndrome: Secondary | ICD-10-CM | POA: Diagnosis not present

## 2020-09-07 DIAGNOSIS — E559 Vitamin D deficiency, unspecified: Secondary | ICD-10-CM | POA: Diagnosis not present

## 2020-09-07 DIAGNOSIS — N8110 Cystocele, unspecified: Secondary | ICD-10-CM | POA: Diagnosis not present

## 2020-09-07 DIAGNOSIS — Z0001 Encounter for general adult medical examination with abnormal findings: Secondary | ICD-10-CM | POA: Diagnosis not present

## 2020-09-07 DIAGNOSIS — E782 Mixed hyperlipidemia: Secondary | ICD-10-CM | POA: Diagnosis not present

## 2020-09-07 DIAGNOSIS — M5410 Radiculopathy, site unspecified: Secondary | ICD-10-CM | POA: Diagnosis not present

## 2020-09-07 DIAGNOSIS — I10 Essential (primary) hypertension: Secondary | ICD-10-CM | POA: Diagnosis not present

## 2020-09-07 DIAGNOSIS — I739 Peripheral vascular disease, unspecified: Secondary | ICD-10-CM | POA: Diagnosis not present

## 2020-09-07 DIAGNOSIS — D649 Anemia, unspecified: Secondary | ICD-10-CM | POA: Diagnosis not present

## 2020-09-07 DIAGNOSIS — R7301 Impaired fasting glucose: Secondary | ICD-10-CM | POA: Diagnosis not present

## 2020-09-07 DIAGNOSIS — N1832 Chronic kidney disease, stage 3b: Secondary | ICD-10-CM | POA: Diagnosis not present

## 2020-09-30 DIAGNOSIS — I10 Essential (primary) hypertension: Secondary | ICD-10-CM | POA: Diagnosis not present

## 2020-09-30 DIAGNOSIS — D649 Anemia, unspecified: Secondary | ICD-10-CM | POA: Diagnosis not present

## 2020-09-30 DIAGNOSIS — E782 Mixed hyperlipidemia: Secondary | ICD-10-CM | POA: Diagnosis not present

## 2020-09-30 DIAGNOSIS — R7301 Impaired fasting glucose: Secondary | ICD-10-CM | POA: Diagnosis not present

## 2020-09-30 DIAGNOSIS — E559 Vitamin D deficiency, unspecified: Secondary | ICD-10-CM | POA: Diagnosis not present

## 2020-09-30 DIAGNOSIS — D509 Iron deficiency anemia, unspecified: Secondary | ICD-10-CM | POA: Diagnosis not present

## 2020-12-07 DIAGNOSIS — N8111 Cystocele, midline: Secondary | ICD-10-CM | POA: Diagnosis not present

## 2020-12-07 DIAGNOSIS — Z0001 Encounter for general adult medical examination with abnormal findings: Secondary | ICD-10-CM | POA: Diagnosis not present

## 2020-12-07 DIAGNOSIS — D509 Iron deficiency anemia, unspecified: Secondary | ICD-10-CM | POA: Diagnosis not present

## 2020-12-07 DIAGNOSIS — R7301 Impaired fasting glucose: Secondary | ICD-10-CM | POA: Diagnosis not present

## 2020-12-07 DIAGNOSIS — R32 Unspecified urinary incontinence: Secondary | ICD-10-CM | POA: Diagnosis not present

## 2020-12-07 DIAGNOSIS — G2581 Restless legs syndrome: Secondary | ICD-10-CM | POA: Diagnosis not present

## 2020-12-07 DIAGNOSIS — E782 Mixed hyperlipidemia: Secondary | ICD-10-CM | POA: Diagnosis not present

## 2020-12-07 DIAGNOSIS — Z0000 Encounter for general adult medical examination without abnormal findings: Secondary | ICD-10-CM | POA: Diagnosis not present

## 2020-12-07 DIAGNOSIS — I2510 Atherosclerotic heart disease of native coronary artery without angina pectoris: Secondary | ICD-10-CM | POA: Diagnosis not present

## 2020-12-24 ENCOUNTER — Ambulatory Visit: Payer: Medicare PPO | Admitting: Obstetrics & Gynecology

## 2020-12-24 ENCOUNTER — Encounter: Payer: Self-pay | Admitting: Obstetrics & Gynecology

## 2020-12-24 ENCOUNTER — Other Ambulatory Visit: Payer: Self-pay

## 2020-12-24 VITALS — BP 142/76 | HR 80 | Ht 66.0 in | Wt 189.4 lb

## 2020-12-24 DIAGNOSIS — N3281 Overactive bladder: Secondary | ICD-10-CM

## 2020-12-24 DIAGNOSIS — N993 Prolapse of vaginal vault after hysterectomy: Secondary | ICD-10-CM

## 2020-12-24 DIAGNOSIS — Z4689 Encounter for fitting and adjustment of other specified devices: Secondary | ICD-10-CM | POA: Diagnosis not present

## 2020-12-24 NOTE — Progress Notes (Signed)
Chief Complaint  Patient presents with  . Pessary Check    Blood pressure (!) 142/76, pulse 80, height 5\' 6"  (1.676 m), weight 189 lb 6.4 oz (85.9 kg).  Pring presents today for routine follow up related to her pessary.   She uses a Irish Lack support #5 She reports no vaginal discharge and no vaginal bleeding   Likert scale(1 not bothersome -5 very bothersome)  :  1  Exam reveals no undue vaginal mucosal pressure of breakdown, no discharge and no vaginal bleeding.  Vaginal Epithelial Abnormality Classification System:   0 0    No abnormalities 1    Epithelial erythema 2    Granulation tissue 3    Epithelial break or erosion, 1 cm or less 4    Epithelial break or erosion, 1 cm or greater  The pessary is removed, cleaned and replaced without difficulty.      ICD-10-CM   1. Pessary maintenance, Milex ring with support #5  Z46.89   2. Vaginal vault prolapse after hysterectomy  N99.3   3. OAB (overactive bladder)  N32.81      Vicy A Sobotta will be sen back in 4 months for continued follow up.  Optometrist, MD  12/24/2020 11:38 AM

## 2021-03-08 DIAGNOSIS — I10 Essential (primary) hypertension: Secondary | ICD-10-CM | POA: Diagnosis not present

## 2021-03-08 DIAGNOSIS — R7301 Impaired fasting glucose: Secondary | ICD-10-CM | POA: Diagnosis not present

## 2021-03-15 DIAGNOSIS — N8110 Cystocele, unspecified: Secondary | ICD-10-CM | POA: Diagnosis not present

## 2021-03-15 DIAGNOSIS — E782 Mixed hyperlipidemia: Secondary | ICD-10-CM | POA: Diagnosis not present

## 2021-03-15 DIAGNOSIS — R7301 Impaired fasting glucose: Secondary | ICD-10-CM | POA: Diagnosis not present

## 2021-03-15 DIAGNOSIS — D509 Iron deficiency anemia, unspecified: Secondary | ICD-10-CM | POA: Diagnosis not present

## 2021-03-15 DIAGNOSIS — N1832 Chronic kidney disease, stage 3b: Secondary | ICD-10-CM | POA: Diagnosis not present

## 2021-03-15 DIAGNOSIS — E559 Vitamin D deficiency, unspecified: Secondary | ICD-10-CM | POA: Diagnosis not present

## 2021-03-15 DIAGNOSIS — I739 Peripheral vascular disease, unspecified: Secondary | ICD-10-CM | POA: Diagnosis not present

## 2021-03-15 DIAGNOSIS — I10 Essential (primary) hypertension: Secondary | ICD-10-CM | POA: Diagnosis not present

## 2021-03-15 DIAGNOSIS — R32 Unspecified urinary incontinence: Secondary | ICD-10-CM | POA: Diagnosis not present

## 2021-04-29 ENCOUNTER — Other Ambulatory Visit: Payer: Self-pay

## 2021-04-29 ENCOUNTER — Ambulatory Visit: Payer: BC Managed Care – PPO | Admitting: Obstetrics & Gynecology

## 2021-04-29 ENCOUNTER — Encounter: Payer: Self-pay | Admitting: Obstetrics & Gynecology

## 2021-04-29 VITALS — BP 151/78 | HR 69 | Ht 66.5 in | Wt 193.4 lb

## 2021-04-29 DIAGNOSIS — Z4689 Encounter for fitting and adjustment of other specified devices: Secondary | ICD-10-CM | POA: Diagnosis not present

## 2021-04-29 DIAGNOSIS — N816 Rectocele: Secondary | ICD-10-CM

## 2021-04-29 DIAGNOSIS — N3281 Overactive bladder: Secondary | ICD-10-CM | POA: Diagnosis not present

## 2021-04-29 DIAGNOSIS — N993 Prolapse of vaginal vault after hysterectomy: Secondary | ICD-10-CM | POA: Diagnosis not present

## 2021-04-29 NOTE — Progress Notes (Signed)
Chief Complaint  Patient presents with   Pessary Check    Blood pressure (!) 151/78, pulse 69, height 5' 6.5" (1.689 m), weight 193 lb 6.4 oz (87.7 kg).  [name] [name] presents today for routine follow up related to her pessary.   She uses a #5 ring She reports no vaginal discharge and no vaginal bleeding.  She notes that the pessary is better than nothing, but is no longer working to hold up the bulge.  She reports slow leaking of her bladder even after using the bathroom. Reports that the bulge has worsened overtime.  Likert scale(1 not bothersome -5 very bothersome)  :  4  Exam reveals no undue vaginal mucosal pressure of breakdown, no discharge and no vaginal bleeding.  Vaginal Epithelial Abnormality Classification System:   0 0    No abnormalities 1    Epithelial erythema 2    Granulation tissue 3    Epithelial break or erosion, 1 cm or less 4    Epithelial break or erosion, 1 cm or greater  Prior to removal of pessary, Stage 3 rectocele noted.  Pessary is removed, cleaned.  Trial of #5 cube; however as suspected not large enough for degree of prolapse.      ICD-10-CM   1. Pelvic organ prolapse quantification stage 3 rectocele  N81.6     2. Vaginal vault prolapse after hysterectomy  N99.3     3. Pessary maintenance, Milex ring with support #5  Z46.89     4. OAB (overactive bladder)  N32.81         Long discussion with patient regarding management.  Reviewed that based on degree of prolapse and diameter of introitus- suspect a pessary will not be sufficient to adequately treat her prolapse. Recommended surgical intervention and referral to Dr. Florian Buff.  Pt declined at this time. When asked further follow up in terms of concern, she mentioned concerns of COVID should she have to be hospitalized. For now plan to stick with current pessary, may consider order larger cube; however concern that this will not improve her current situation.  [name] will be sen back in 4  months for continued follow up.  Sharon Seller, DO  04/29/2021 12:29 PM

## 2021-09-02 ENCOUNTER — Ambulatory Visit: Payer: Medicare PPO | Admitting: Obstetrics & Gynecology

## 2021-09-02 ENCOUNTER — Other Ambulatory Visit: Payer: Self-pay

## 2021-09-02 ENCOUNTER — Encounter: Payer: Self-pay | Admitting: Obstetrics & Gynecology

## 2021-09-02 VITALS — BP 140/71 | HR 59 | Wt 193.0 lb

## 2021-09-02 DIAGNOSIS — N816 Rectocele: Secondary | ICD-10-CM

## 2021-09-02 DIAGNOSIS — N993 Prolapse of vaginal vault after hysterectomy: Secondary | ICD-10-CM | POA: Diagnosis not present

## 2021-09-02 DIAGNOSIS — Z4689 Encounter for fitting and adjustment of other specified devices: Secondary | ICD-10-CM

## 2021-09-02 NOTE — Progress Notes (Signed)
Chief Complaint  Patient presents with   Pessary Check    Blood pressure 140/71, pulse (!) 59, weight 193 lb (87.5 kg).  [name] [name] presents today for routine follow up related to her pessary.   She uses a Milex ring with support #5 She reports no vaginal discharge and no vaginal bleeding   Likert scale(1 not bothersome -5 very bothersome)  :  1  Exam reveals no undue vaginal mucosal pressure of breakdown, no discharge and no vaginal bleeding.  Vaginal Epithelial Abnormality Classification System:   0 0    No abnormalities 1    Epithelial erythema 2    Granulation tissue 3    Epithelial break or erosion, 1 cm or less 4    Epithelial break or erosion, 1 cm or greater  The pessary is removed, cleaned and replaced without difficulty.      ICD-10-CM   1. Pessary maintenance, Milex ring with support #5  Z46.89     2. Pelvic organ prolapse quantification stage 3 rectocele  N81.6     3. Vaginal vault prolapse after hysterectomy  N99.3        [name] [name] will be sen back in 4 months for continued follow up.  Lazaro Arms, MD  09/02/2021 11:43 AM

## 2021-09-20 DIAGNOSIS — D509 Iron deficiency anemia, unspecified: Secondary | ICD-10-CM | POA: Diagnosis not present

## 2021-09-20 DIAGNOSIS — E559 Vitamin D deficiency, unspecified: Secondary | ICD-10-CM | POA: Diagnosis not present

## 2021-09-20 DIAGNOSIS — E782 Mixed hyperlipidemia: Secondary | ICD-10-CM | POA: Diagnosis not present

## 2021-09-20 DIAGNOSIS — R7301 Impaired fasting glucose: Secondary | ICD-10-CM | POA: Diagnosis not present

## 2021-09-27 DIAGNOSIS — E559 Vitamin D deficiency, unspecified: Secondary | ICD-10-CM | POA: Diagnosis not present

## 2021-09-27 DIAGNOSIS — Z0001 Encounter for general adult medical examination with abnormal findings: Secondary | ICD-10-CM | POA: Diagnosis not present

## 2021-09-27 DIAGNOSIS — R7301 Impaired fasting glucose: Secondary | ICD-10-CM | POA: Diagnosis not present

## 2021-09-27 DIAGNOSIS — D509 Iron deficiency anemia, unspecified: Secondary | ICD-10-CM | POA: Diagnosis not present

## 2021-09-27 DIAGNOSIS — I10 Essential (primary) hypertension: Secondary | ICD-10-CM | POA: Diagnosis not present

## 2021-09-27 DIAGNOSIS — I739 Peripheral vascular disease, unspecified: Secondary | ICD-10-CM | POA: Diagnosis not present

## 2021-09-27 DIAGNOSIS — E782 Mixed hyperlipidemia: Secondary | ICD-10-CM | POA: Diagnosis not present

## 2021-09-27 DIAGNOSIS — N1832 Chronic kidney disease, stage 3b: Secondary | ICD-10-CM | POA: Diagnosis not present

## 2021-09-27 DIAGNOSIS — Z23 Encounter for immunization: Secondary | ICD-10-CM | POA: Diagnosis not present

## 2021-12-13 ENCOUNTER — Encounter: Payer: Self-pay | Admitting: Obstetrics & Gynecology

## 2021-12-13 ENCOUNTER — Ambulatory Visit: Payer: Medicare PPO | Admitting: Obstetrics & Gynecology

## 2021-12-13 ENCOUNTER — Other Ambulatory Visit: Payer: Self-pay

## 2021-12-13 VITALS — BP 157/77 | HR 85 | Wt 194.0 lb

## 2021-12-13 DIAGNOSIS — Z4689 Encounter for fitting and adjustment of other specified devices: Secondary | ICD-10-CM

## 2021-12-13 DIAGNOSIS — N816 Rectocele: Secondary | ICD-10-CM

## 2021-12-13 DIAGNOSIS — N993 Prolapse of vaginal vault after hysterectomy: Secondary | ICD-10-CM

## 2021-12-13 NOTE — Progress Notes (Signed)
Chief Complaint  Patient presents with   Pessary Maintenance    Blood pressure (!) 157/77, pulse 85, weight 194 lb (88 kg).  [name] [name] presents today for routine follow up related to her pessary.   She uses a Milex ring with support #5 She reports no vaginal discharge and no vaginal bleeding   Likert scale(1 not bothersome -5 very bothersome)  :  1  Exam reveals no undue vaginal mucosal pressure of breakdown, no discharge and no vaginal bleeding.  Vaginal Epithelial Abnormality Classification System:   0 0    No abnormalities 1    Epithelial erythema 2    Granulation tissue 3    Epithelial break or erosion, 1 cm or less 4    Epithelial break or erosion, 1 cm or greater  The pessary is removed, cleaned and replaced without difficulty.    No diagnosis found.   [name] [name] will be sen back in 4 months for continued follow up.  Lazaro Arms, MD  12/13/2021 11:37 AM

## 2021-12-23 ENCOUNTER — Ambulatory Visit: Payer: Medicare PPO | Admitting: Obstetrics & Gynecology

## 2022-01-24 DIAGNOSIS — E782 Mixed hyperlipidemia: Secondary | ICD-10-CM | POA: Diagnosis not present

## 2022-01-24 DIAGNOSIS — R7301 Impaired fasting glucose: Secondary | ICD-10-CM | POA: Diagnosis not present

## 2022-01-24 DIAGNOSIS — E559 Vitamin D deficiency, unspecified: Secondary | ICD-10-CM | POA: Diagnosis not present

## 2022-01-24 DIAGNOSIS — D509 Iron deficiency anemia, unspecified: Secondary | ICD-10-CM | POA: Diagnosis not present

## 2022-01-31 DIAGNOSIS — N8110 Cystocele, unspecified: Secondary | ICD-10-CM | POA: Diagnosis not present

## 2022-01-31 DIAGNOSIS — I10 Essential (primary) hypertension: Secondary | ICD-10-CM | POA: Diagnosis not present

## 2022-01-31 DIAGNOSIS — R7303 Prediabetes: Secondary | ICD-10-CM | POA: Diagnosis not present

## 2022-01-31 DIAGNOSIS — D509 Iron deficiency anemia, unspecified: Secondary | ICD-10-CM | POA: Diagnosis not present

## 2022-01-31 DIAGNOSIS — E782 Mixed hyperlipidemia: Secondary | ICD-10-CM | POA: Diagnosis not present

## 2022-01-31 DIAGNOSIS — I739 Peripheral vascular disease, unspecified: Secondary | ICD-10-CM | POA: Diagnosis not present

## 2022-01-31 DIAGNOSIS — R32 Unspecified urinary incontinence: Secondary | ICD-10-CM | POA: Diagnosis not present

## 2022-01-31 DIAGNOSIS — E559 Vitamin D deficiency, unspecified: Secondary | ICD-10-CM | POA: Diagnosis not present

## 2022-01-31 DIAGNOSIS — N184 Chronic kidney disease, stage 4 (severe): Secondary | ICD-10-CM | POA: Diagnosis not present

## 2022-02-06 ENCOUNTER — Other Ambulatory Visit: Payer: Self-pay | Admitting: Internal Medicine

## 2022-02-06 ENCOUNTER — Other Ambulatory Visit (HOSPITAL_COMMUNITY): Payer: Self-pay | Admitting: Internal Medicine

## 2022-02-06 DIAGNOSIS — N1832 Chronic kidney disease, stage 3b: Secondary | ICD-10-CM | POA: Diagnosis not present

## 2022-02-06 DIAGNOSIS — E875 Hyperkalemia: Secondary | ICD-10-CM | POA: Diagnosis not present

## 2022-02-06 DIAGNOSIS — R809 Proteinuria, unspecified: Secondary | ICD-10-CM | POA: Diagnosis not present

## 2022-02-06 DIAGNOSIS — I10 Essential (primary) hypertension: Secondary | ICD-10-CM | POA: Diagnosis not present

## 2022-04-13 ENCOUNTER — Other Ambulatory Visit: Payer: Self-pay | Admitting: Internal Medicine

## 2022-04-13 ENCOUNTER — Other Ambulatory Visit (HOSPITAL_COMMUNITY): Payer: Self-pay | Admitting: Internal Medicine

## 2022-04-13 DIAGNOSIS — N1832 Chronic kidney disease, stage 3b: Secondary | ICD-10-CM

## 2022-04-14 ENCOUNTER — Ambulatory Visit: Payer: Medicare PPO | Admitting: Obstetrics & Gynecology

## 2022-04-14 ENCOUNTER — Encounter: Payer: Self-pay | Admitting: Obstetrics & Gynecology

## 2022-04-14 VITALS — BP 152/78 | HR 82 | Ht 67.0 in | Wt 194.0 lb

## 2022-04-14 DIAGNOSIS — N816 Rectocele: Secondary | ICD-10-CM

## 2022-04-14 DIAGNOSIS — N993 Prolapse of vaginal vault after hysterectomy: Secondary | ICD-10-CM | POA: Diagnosis not present

## 2022-04-14 DIAGNOSIS — Z4689 Encounter for fitting and adjustment of other specified devices: Secondary | ICD-10-CM

## 2022-04-14 NOTE — Progress Notes (Signed)
Chief Complaint  Patient presents with   Pessary Check    Blood pressure (!) 152/78, pulse 82, height 5\' 7"  (1.702 m), weight 194 lb (88 kg).  [name] [name] presents today for routine follow up related to her pessary.   She uses a Milex ring with support #5 She reports no vaginal discharge and no vaginal bleeding   Likert scale(1 not bothersome -5 very bothersome)  :  1  Exam reveals no undue vaginal mucosal pressure of breakdown, no discharge and no vaginal bleeding.  Vaginal Epithelial Abnormality Classification System:   0 0    No abnormalities 1    Epithelial erythema 2    Granulation tissue 3    Epithelial break or erosion, 1 cm or less 4    Epithelial break or erosion, 1 cm or greater  The pessary is removed, cleaned and replaced without difficulty.      ICD-10-CM   1. Pessary maintenance, Milex ring with support #5  Z46.89     2. Pelvic organ prolapse quantification stage 3 rectocele  N81.6     3. Vaginal vault prolapse after hysterectomy  N99.3        Jakita A Baird will be sen back in 4 months for continued follow up.  Irish Lack, MD  04/14/2022 11:11 AM

## 2022-04-25 ENCOUNTER — Ambulatory Visit (HOSPITAL_COMMUNITY)
Admission: RE | Admit: 2022-04-25 | Discharge: 2022-04-25 | Disposition: A | Discharge status: Home / Self Care | Payer: Medicare PPO | Source: Ambulatory Visit | Attending: Internal Medicine | Admitting: Internal Medicine

## 2022-04-25 DIAGNOSIS — N189 Chronic kidney disease, unspecified: Secondary | ICD-10-CM | POA: Diagnosis not present

## 2022-04-25 DIAGNOSIS — N1832 Chronic kidney disease, stage 3b: Secondary | ICD-10-CM | POA: Insufficient documentation

## 2022-04-25 DIAGNOSIS — N3289 Other specified disorders of bladder: Secondary | ICD-10-CM | POA: Diagnosis not present

## 2022-04-25 DIAGNOSIS — N200 Calculus of kidney: Secondary | ICD-10-CM | POA: Diagnosis not present

## 2022-04-26 DIAGNOSIS — E875 Hyperkalemia: Secondary | ICD-10-CM | POA: Diagnosis not present

## 2022-04-26 DIAGNOSIS — R809 Proteinuria, unspecified: Secondary | ICD-10-CM | POA: Diagnosis not present

## 2022-04-26 DIAGNOSIS — N184 Chronic kidney disease, stage 4 (severe): Secondary | ICD-10-CM | POA: Diagnosis not present

## 2022-04-26 DIAGNOSIS — Z6830 Body mass index (BMI) 30.0-30.9, adult: Secondary | ICD-10-CM | POA: Diagnosis not present

## 2022-04-26 DIAGNOSIS — I129 Hypertensive chronic kidney disease with stage 1 through stage 4 chronic kidney disease, or unspecified chronic kidney disease: Secondary | ICD-10-CM | POA: Diagnosis not present

## 2022-04-26 DIAGNOSIS — D638 Anemia in other chronic diseases classified elsewhere: Secondary | ICD-10-CM | POA: Diagnosis not present

## 2022-04-26 DIAGNOSIS — N170 Acute kidney failure with tubular necrosis: Secondary | ICD-10-CM | POA: Diagnosis not present

## 2022-04-26 DIAGNOSIS — E559 Vitamin D deficiency, unspecified: Secondary | ICD-10-CM | POA: Diagnosis not present

## 2022-04-26 DIAGNOSIS — Z7189 Other specified counseling: Secondary | ICD-10-CM | POA: Diagnosis not present

## 2022-05-02 DIAGNOSIS — D509 Iron deficiency anemia, unspecified: Secondary | ICD-10-CM | POA: Diagnosis not present

## 2022-05-02 DIAGNOSIS — E782 Mixed hyperlipidemia: Secondary | ICD-10-CM | POA: Diagnosis not present

## 2022-05-02 DIAGNOSIS — E559 Vitamin D deficiency, unspecified: Secondary | ICD-10-CM | POA: Diagnosis not present

## 2022-05-02 DIAGNOSIS — R7303 Prediabetes: Secondary | ICD-10-CM | POA: Diagnosis not present

## 2022-05-04 DIAGNOSIS — Z79899 Other long term (current) drug therapy: Secondary | ICD-10-CM | POA: Diagnosis not present

## 2022-05-04 DIAGNOSIS — N184 Chronic kidney disease, stage 4 (severe): Secondary | ICD-10-CM | POA: Diagnosis not present

## 2022-05-04 DIAGNOSIS — I129 Hypertensive chronic kidney disease with stage 1 through stage 4 chronic kidney disease, or unspecified chronic kidney disease: Secondary | ICD-10-CM | POA: Diagnosis not present

## 2022-05-04 DIAGNOSIS — E875 Hyperkalemia: Secondary | ICD-10-CM | POA: Diagnosis not present

## 2022-05-04 DIAGNOSIS — E559 Vitamin D deficiency, unspecified: Secondary | ICD-10-CM | POA: Diagnosis not present

## 2022-05-04 DIAGNOSIS — R809 Proteinuria, unspecified: Secondary | ICD-10-CM | POA: Diagnosis not present

## 2022-05-04 DIAGNOSIS — Z6830 Body mass index (BMI) 30.0-30.9, adult: Secondary | ICD-10-CM | POA: Diagnosis not present

## 2022-05-04 DIAGNOSIS — D638 Anemia in other chronic diseases classified elsewhere: Secondary | ICD-10-CM | POA: Diagnosis not present

## 2022-05-08 DIAGNOSIS — I739 Peripheral vascular disease, unspecified: Secondary | ICD-10-CM | POA: Diagnosis not present

## 2022-05-08 DIAGNOSIS — R7303 Prediabetes: Secondary | ICD-10-CM | POA: Diagnosis not present

## 2022-05-08 DIAGNOSIS — D509 Iron deficiency anemia, unspecified: Secondary | ICD-10-CM | POA: Diagnosis not present

## 2022-05-08 DIAGNOSIS — N8110 Cystocele, unspecified: Secondary | ICD-10-CM | POA: Diagnosis not present

## 2022-05-08 DIAGNOSIS — I10 Essential (primary) hypertension: Secondary | ICD-10-CM | POA: Diagnosis not present

## 2022-05-08 DIAGNOSIS — R32 Unspecified urinary incontinence: Secondary | ICD-10-CM | POA: Diagnosis not present

## 2022-05-08 DIAGNOSIS — E782 Mixed hyperlipidemia: Secondary | ICD-10-CM | POA: Diagnosis not present

## 2022-05-08 DIAGNOSIS — E559 Vitamin D deficiency, unspecified: Secondary | ICD-10-CM | POA: Diagnosis not present

## 2022-05-08 DIAGNOSIS — N184 Chronic kidney disease, stage 4 (severe): Secondary | ICD-10-CM | POA: Diagnosis not present

## 2022-06-07 DIAGNOSIS — R809 Proteinuria, unspecified: Secondary | ICD-10-CM | POA: Diagnosis not present

## 2022-06-07 DIAGNOSIS — I129 Hypertensive chronic kidney disease with stage 1 through stage 4 chronic kidney disease, or unspecified chronic kidney disease: Secondary | ICD-10-CM | POA: Diagnosis not present

## 2022-06-07 DIAGNOSIS — E875 Hyperkalemia: Secondary | ICD-10-CM | POA: Diagnosis not present

## 2022-06-07 DIAGNOSIS — Z6830 Body mass index (BMI) 30.0-30.9, adult: Secondary | ICD-10-CM | POA: Diagnosis not present

## 2022-06-07 DIAGNOSIS — N170 Acute kidney failure with tubular necrosis: Secondary | ICD-10-CM | POA: Diagnosis not present

## 2022-06-07 DIAGNOSIS — R778 Other specified abnormalities of plasma proteins: Secondary | ICD-10-CM | POA: Diagnosis not present

## 2022-06-07 DIAGNOSIS — N1832 Chronic kidney disease, stage 3b: Secondary | ICD-10-CM | POA: Diagnosis not present

## 2022-06-07 DIAGNOSIS — D638 Anemia in other chronic diseases classified elsewhere: Secondary | ICD-10-CM | POA: Diagnosis not present

## 2022-08-01 DIAGNOSIS — Z23 Encounter for immunization: Secondary | ICD-10-CM | POA: Diagnosis not present

## 2022-08-04 ENCOUNTER — Ambulatory Visit: Payer: Medicare PPO | Admitting: Obstetrics & Gynecology

## 2022-08-04 ENCOUNTER — Encounter: Payer: Self-pay | Admitting: Obstetrics & Gynecology

## 2022-08-04 VITALS — BP 135/75 | HR 58 | Ht 67.0 in

## 2022-08-04 DIAGNOSIS — Z4689 Encounter for fitting and adjustment of other specified devices: Secondary | ICD-10-CM | POA: Diagnosis not present

## 2022-08-04 DIAGNOSIS — N993 Prolapse of vaginal vault after hysterectomy: Secondary | ICD-10-CM | POA: Diagnosis not present

## 2022-08-04 NOTE — Progress Notes (Signed)
Chief Complaint  Patient presents with   Pessary Check    Blood pressure 135/75, pulse (!) 58, height 5\' 7"  (1.702 m).  [name] [name] presents today for routine follow up related to her pessary.   She uses a Milex ring with support #5 She reports no vaginal discharge and little vaginal bleeding   Likert scale(1 not bothersome -5 very bothersome)  :  2  Exam reveals no undue vaginal mucosal pressure of breakdown, no discharge and little vaginal bleeding.  Vaginal Epithelial Abnormality Classification System:   0 0    No abnormalities 1    Epithelial erythema 2    Granulation tissue 3    Epithelial break or erosion, 1 cm or less 4    Epithelial break or erosion, 1 cm or greater  The pessary is removed, cleaned and replaced without difficulty.      ICD-10-CM   1. Pessary maintenance, Milex ring with support #5  Z46.89     2. Vaginal vault prolapse after hysterectomy  N99.3        [name] [name] will be sen back in 4 months for continued follow up.  Florian Buff, MD  08/04/2022 12:13 PM

## 2022-08-10 ENCOUNTER — Ambulatory Visit: Payer: Medicare PPO | Admitting: Obstetrics & Gynecology

## 2022-08-11 ENCOUNTER — Ambulatory Visit: Payer: Medicare PPO | Admitting: Obstetrics & Gynecology

## 2022-08-14 DIAGNOSIS — Z6830 Body mass index (BMI) 30.0-30.9, adult: Secondary | ICD-10-CM | POA: Diagnosis not present

## 2022-08-14 DIAGNOSIS — N170 Acute kidney failure with tubular necrosis: Secondary | ICD-10-CM | POA: Diagnosis not present

## 2022-08-14 DIAGNOSIS — N1832 Chronic kidney disease, stage 3b: Secondary | ICD-10-CM | POA: Diagnosis not present

## 2022-08-14 DIAGNOSIS — D638 Anemia in other chronic diseases classified elsewhere: Secondary | ICD-10-CM | POA: Diagnosis not present

## 2022-08-14 DIAGNOSIS — I129 Hypertensive chronic kidney disease with stage 1 through stage 4 chronic kidney disease, or unspecified chronic kidney disease: Secondary | ICD-10-CM | POA: Diagnosis not present

## 2022-08-14 DIAGNOSIS — E875 Hyperkalemia: Secondary | ICD-10-CM | POA: Diagnosis not present

## 2022-08-14 DIAGNOSIS — R809 Proteinuria, unspecified: Secondary | ICD-10-CM | POA: Diagnosis not present

## 2022-08-14 DIAGNOSIS — R778 Other specified abnormalities of plasma proteins: Secondary | ICD-10-CM | POA: Diagnosis not present

## 2022-08-17 DIAGNOSIS — D638 Anemia in other chronic diseases classified elsewhere: Secondary | ICD-10-CM | POA: Diagnosis not present

## 2022-08-17 DIAGNOSIS — R809 Proteinuria, unspecified: Secondary | ICD-10-CM | POA: Diagnosis not present

## 2022-08-17 DIAGNOSIS — I129 Hypertensive chronic kidney disease with stage 1 through stage 4 chronic kidney disease, or unspecified chronic kidney disease: Secondary | ICD-10-CM | POA: Diagnosis not present

## 2022-08-17 DIAGNOSIS — M10072 Idiopathic gout, left ankle and foot: Secondary | ICD-10-CM | POA: Diagnosis not present

## 2022-08-17 DIAGNOSIS — N1832 Chronic kidney disease, stage 3b: Secondary | ICD-10-CM | POA: Diagnosis not present

## 2022-08-17 DIAGNOSIS — E210 Primary hyperparathyroidism: Secondary | ICD-10-CM | POA: Diagnosis not present

## 2022-08-17 DIAGNOSIS — E875 Hyperkalemia: Secondary | ICD-10-CM | POA: Diagnosis not present

## 2022-08-31 DIAGNOSIS — N1832 Chronic kidney disease, stage 3b: Secondary | ICD-10-CM | POA: Diagnosis not present

## 2022-08-31 DIAGNOSIS — E210 Primary hyperparathyroidism: Secondary | ICD-10-CM | POA: Diagnosis not present

## 2022-08-31 DIAGNOSIS — R809 Proteinuria, unspecified: Secondary | ICD-10-CM | POA: Diagnosis not present

## 2022-08-31 DIAGNOSIS — D638 Anemia in other chronic diseases classified elsewhere: Secondary | ICD-10-CM | POA: Diagnosis not present

## 2022-08-31 DIAGNOSIS — I129 Hypertensive chronic kidney disease with stage 1 through stage 4 chronic kidney disease, or unspecified chronic kidney disease: Secondary | ICD-10-CM | POA: Diagnosis not present

## 2022-08-31 DIAGNOSIS — E875 Hyperkalemia: Secondary | ICD-10-CM | POA: Diagnosis not present

## 2022-10-23 DIAGNOSIS — N1832 Chronic kidney disease, stage 3b: Secondary | ICD-10-CM | POA: Diagnosis not present

## 2022-10-23 DIAGNOSIS — E210 Primary hyperparathyroidism: Secondary | ICD-10-CM | POA: Diagnosis not present

## 2022-10-23 DIAGNOSIS — R809 Proteinuria, unspecified: Secondary | ICD-10-CM | POA: Diagnosis not present

## 2022-10-23 DIAGNOSIS — E875 Hyperkalemia: Secondary | ICD-10-CM | POA: Diagnosis not present

## 2022-10-23 DIAGNOSIS — I129 Hypertensive chronic kidney disease with stage 1 through stage 4 chronic kidney disease, or unspecified chronic kidney disease: Secondary | ICD-10-CM | POA: Diagnosis not present

## 2022-10-23 DIAGNOSIS — D638 Anemia in other chronic diseases classified elsewhere: Secondary | ICD-10-CM | POA: Diagnosis not present

## 2022-11-06 DIAGNOSIS — I129 Hypertensive chronic kidney disease with stage 1 through stage 4 chronic kidney disease, or unspecified chronic kidney disease: Secondary | ICD-10-CM | POA: Diagnosis not present

## 2022-11-06 DIAGNOSIS — R809 Proteinuria, unspecified: Secondary | ICD-10-CM | POA: Diagnosis not present

## 2022-11-06 DIAGNOSIS — Z5181 Encounter for therapeutic drug level monitoring: Secondary | ICD-10-CM | POA: Diagnosis not present

## 2022-11-06 DIAGNOSIS — N1832 Chronic kidney disease, stage 3b: Secondary | ICD-10-CM | POA: Diagnosis not present

## 2022-11-06 DIAGNOSIS — E559 Vitamin D deficiency, unspecified: Secondary | ICD-10-CM | POA: Diagnosis not present

## 2022-11-06 DIAGNOSIS — D638 Anemia in other chronic diseases classified elsewhere: Secondary | ICD-10-CM | POA: Diagnosis not present

## 2022-11-09 DIAGNOSIS — E559 Vitamin D deficiency, unspecified: Secondary | ICD-10-CM | POA: Diagnosis not present

## 2022-11-09 DIAGNOSIS — R7303 Prediabetes: Secondary | ICD-10-CM | POA: Diagnosis not present

## 2022-11-09 DIAGNOSIS — E782 Mixed hyperlipidemia: Secondary | ICD-10-CM | POA: Diagnosis not present

## 2022-11-14 DIAGNOSIS — Z0000 Encounter for general adult medical examination without abnormal findings: Secondary | ICD-10-CM | POA: Diagnosis not present

## 2022-11-14 DIAGNOSIS — N184 Chronic kidney disease, stage 4 (severe): Secondary | ICD-10-CM | POA: Diagnosis not present

## 2022-11-14 DIAGNOSIS — I700 Atherosclerosis of aorta: Secondary | ICD-10-CM | POA: Diagnosis not present

## 2022-11-14 DIAGNOSIS — I10 Essential (primary) hypertension: Secondary | ICD-10-CM | POA: Diagnosis not present

## 2022-11-14 DIAGNOSIS — Z0001 Encounter for general adult medical examination with abnormal findings: Secondary | ICD-10-CM | POA: Diagnosis not present

## 2022-11-14 DIAGNOSIS — E782 Mixed hyperlipidemia: Secondary | ICD-10-CM | POA: Diagnosis not present

## 2022-11-14 DIAGNOSIS — I129 Hypertensive chronic kidney disease with stage 1 through stage 4 chronic kidney disease, or unspecified chronic kidney disease: Secondary | ICD-10-CM | POA: Diagnosis not present

## 2022-11-14 DIAGNOSIS — G2581 Restless legs syndrome: Secondary | ICD-10-CM | POA: Diagnosis not present

## 2022-12-05 ENCOUNTER — Ambulatory Visit: Payer: Medicare PPO | Admitting: Obstetrics & Gynecology

## 2022-12-05 ENCOUNTER — Encounter: Payer: Self-pay | Admitting: Obstetrics & Gynecology

## 2022-12-05 VITALS — BP 124/56 | HR 61 | Ht 67.0 in | Wt 183.0 lb

## 2022-12-05 DIAGNOSIS — N3281 Overactive bladder: Secondary | ICD-10-CM

## 2022-12-05 DIAGNOSIS — Z4689 Encounter for fitting and adjustment of other specified devices: Secondary | ICD-10-CM | POA: Diagnosis not present

## 2022-12-05 DIAGNOSIS — N816 Rectocele: Secondary | ICD-10-CM

## 2022-12-05 DIAGNOSIS — N993 Prolapse of vaginal vault after hysterectomy: Secondary | ICD-10-CM

## 2022-12-05 NOTE — Progress Notes (Signed)
Chief Complaint  Patient presents with   Pessary Maintenance    Blood pressure (!) 124/56, pulse 61, height 5' 7"$  (1.702 m), weight 183 lb (83 kg).  [name] [name] presents today for routine follow up related to her pessary.   She uses a Milex ring with support #5 She reports no vaginal discharge and no vaginal bleeding   She is concerned about sensation of inadequate bladder emptying  Likert scale(1 not bothersome -5 very bothersome)  :  1  Exam reveals no undue vaginal mucosal pressure of breakdown, no discharge and no vaginal bleeding.  Vaginal Epithelial Abnormality Classification System:   0 0    No abnormalities 1    Epithelial erythema 2    Granulation tissue 3    Epithelial break or erosion, 1 cm or less 4    Epithelial break or erosion, 1 cm or greater  The pessary is removed, cleaned and replaced without difficulty.      ICD-10-CM   1. Pessary maintenance, Milex ring with support #5  Z46.89     2. Vaginal vault prolapse after hysterectomy  N99.3     3. Pelvic organ prolapse quantification stage 3 rectocele  N81.6     4. OAB (overactive bladder)  N32.81        [name] [name] will be sen back in 4 months for continued follow up.  Florian Buff, MD  12/05/2022 11:54 AM

## 2022-12-12 DIAGNOSIS — Z5181 Encounter for therapeutic drug level monitoring: Secondary | ICD-10-CM | POA: Diagnosis not present

## 2022-12-12 DIAGNOSIS — I129 Hypertensive chronic kidney disease with stage 1 through stage 4 chronic kidney disease, or unspecified chronic kidney disease: Secondary | ICD-10-CM | POA: Diagnosis not present

## 2022-12-12 DIAGNOSIS — D638 Anemia in other chronic diseases classified elsewhere: Secondary | ICD-10-CM | POA: Diagnosis not present

## 2022-12-12 DIAGNOSIS — N1832 Chronic kidney disease, stage 3b: Secondary | ICD-10-CM | POA: Diagnosis not present

## 2022-12-12 DIAGNOSIS — E559 Vitamin D deficiency, unspecified: Secondary | ICD-10-CM | POA: Diagnosis not present

## 2022-12-12 DIAGNOSIS — R809 Proteinuria, unspecified: Secondary | ICD-10-CM | POA: Diagnosis not present

## 2023-02-15 DIAGNOSIS — I10 Essential (primary) hypertension: Secondary | ICD-10-CM | POA: Diagnosis not present

## 2023-02-15 DIAGNOSIS — M109 Gout, unspecified: Secondary | ICD-10-CM | POA: Diagnosis not present

## 2023-03-29 DIAGNOSIS — D638 Anemia in other chronic diseases classified elsewhere: Secondary | ICD-10-CM | POA: Diagnosis not present

## 2023-03-29 DIAGNOSIS — N189 Chronic kidney disease, unspecified: Secondary | ICD-10-CM | POA: Diagnosis not present

## 2023-03-29 DIAGNOSIS — N1832 Chronic kidney disease, stage 3b: Secondary | ICD-10-CM | POA: Diagnosis not present

## 2023-03-29 DIAGNOSIS — Z79899 Other long term (current) drug therapy: Secondary | ICD-10-CM | POA: Diagnosis not present

## 2023-03-29 DIAGNOSIS — R809 Proteinuria, unspecified: Secondary | ICD-10-CM | POA: Diagnosis not present

## 2023-04-03 ENCOUNTER — Ambulatory Visit: Payer: Medicare PPO | Admitting: Obstetrics & Gynecology

## 2023-04-03 ENCOUNTER — Encounter: Payer: Self-pay | Admitting: Obstetrics & Gynecology

## 2023-04-03 VITALS — BP 136/83 | HR 84 | Wt 179.0 lb

## 2023-04-03 DIAGNOSIS — Z4689 Encounter for fitting and adjustment of other specified devices: Secondary | ICD-10-CM | POA: Diagnosis not present

## 2023-04-03 DIAGNOSIS — N993 Prolapse of vaginal vault after hysterectomy: Secondary | ICD-10-CM | POA: Diagnosis not present

## 2023-04-03 NOTE — Progress Notes (Signed)
Chief Complaint  Patient presents with   Pessary Check    Blood pressure 136/83, pulse 84, weight 179 lb (81.2 kg).  [name] [name] presents today for routine follow up related to her pessary.   She uses a Milex ring with support #6-->replaced with a #7 today as pt states she is having more prolapse "aound" the pessary Years ago when we used a bigger size she had rectal pressure but she wants to try it She reports no vaginal discharge and no vaginal bleeding   Likert scale(1 not bothersome -5 very bothersome)  :  1  Exam reveals no undue vaginal mucosal pressure of breakdown, no discharge and no vaginal bleeding.  Vaginal Epithelial Abnormality Classification System:   0 0    No abnormalities 1    Epithelial erythema 2    Granulation tissue 3    Epithelial break or erosion, 1 cm or less 4    Epithelial break or erosion, 1 cm or greater  The pessary is removed, cleaned and replaced without difficulty.      ICD-10-CM   1. Pessary maintenance, Milex ring with support #6  Z46.89    evidently it has been a #6 but I have Id's it as a #5-->requested larger size so placed a #7 today    2. Vaginal vault prolapse after hysterectomy  N99.3        [name] [name] will be sen back in 4 months for continued follow up.  Lazaro Arms, MD  04/03/2023 11:11 AM

## 2023-04-05 ENCOUNTER — Ambulatory Visit: Payer: Medicare PPO | Admitting: Obstetrics & Gynecology

## 2023-04-09 DIAGNOSIS — E559 Vitamin D deficiency, unspecified: Secondary | ICD-10-CM | POA: Diagnosis not present

## 2023-04-09 DIAGNOSIS — Z5181 Encounter for therapeutic drug level monitoring: Secondary | ICD-10-CM | POA: Diagnosis not present

## 2023-04-09 DIAGNOSIS — N1832 Chronic kidney disease, stage 3b: Secondary | ICD-10-CM | POA: Diagnosis not present

## 2023-06-12 DIAGNOSIS — R7303 Prediabetes: Secondary | ICD-10-CM | POA: Diagnosis not present

## 2023-06-12 DIAGNOSIS — E559 Vitamin D deficiency, unspecified: Secondary | ICD-10-CM | POA: Diagnosis not present

## 2023-06-12 DIAGNOSIS — E782 Mixed hyperlipidemia: Secondary | ICD-10-CM | POA: Diagnosis not present

## 2023-06-19 DIAGNOSIS — N184 Chronic kidney disease, stage 4 (severe): Secondary | ICD-10-CM | POA: Diagnosis not present

## 2023-06-19 DIAGNOSIS — I739 Peripheral vascular disease, unspecified: Secondary | ICD-10-CM | POA: Diagnosis not present

## 2023-06-19 DIAGNOSIS — I700 Atherosclerosis of aorta: Secondary | ICD-10-CM | POA: Diagnosis not present

## 2023-06-19 DIAGNOSIS — Z23 Encounter for immunization: Secondary | ICD-10-CM | POA: Diagnosis not present

## 2023-06-19 DIAGNOSIS — E559 Vitamin D deficiency, unspecified: Secondary | ICD-10-CM | POA: Diagnosis not present

## 2023-06-19 DIAGNOSIS — E782 Mixed hyperlipidemia: Secondary | ICD-10-CM | POA: Diagnosis not present

## 2023-06-19 DIAGNOSIS — I129 Hypertensive chronic kidney disease with stage 1 through stage 4 chronic kidney disease, or unspecified chronic kidney disease: Secondary | ICD-10-CM | POA: Diagnosis not present

## 2023-06-19 DIAGNOSIS — D509 Iron deficiency anemia, unspecified: Secondary | ICD-10-CM | POA: Diagnosis not present

## 2023-06-19 DIAGNOSIS — I10 Essential (primary) hypertension: Secondary | ICD-10-CM | POA: Diagnosis not present

## 2023-06-19 DIAGNOSIS — R7303 Prediabetes: Secondary | ICD-10-CM | POA: Diagnosis not present

## 2023-06-26 DIAGNOSIS — Z5181 Encounter for therapeutic drug level monitoring: Secondary | ICD-10-CM | POA: Diagnosis not present

## 2023-06-26 DIAGNOSIS — N1832 Chronic kidney disease, stage 3b: Secondary | ICD-10-CM | POA: Diagnosis not present

## 2023-06-26 DIAGNOSIS — E559 Vitamin D deficiency, unspecified: Secondary | ICD-10-CM | POA: Diagnosis not present

## 2023-06-26 DIAGNOSIS — I129 Hypertensive chronic kidney disease with stage 1 through stage 4 chronic kidney disease, or unspecified chronic kidney disease: Secondary | ICD-10-CM | POA: Diagnosis not present

## 2023-06-26 DIAGNOSIS — N189 Chronic kidney disease, unspecified: Secondary | ICD-10-CM | POA: Diagnosis not present

## 2023-07-18 DIAGNOSIS — N184 Chronic kidney disease, stage 4 (severe): Secondary | ICD-10-CM | POA: Diagnosis not present

## 2023-07-18 DIAGNOSIS — D638 Anemia in other chronic diseases classified elsewhere: Secondary | ICD-10-CM | POA: Diagnosis not present

## 2023-07-18 DIAGNOSIS — R809 Proteinuria, unspecified: Secondary | ICD-10-CM | POA: Diagnosis not present

## 2023-07-18 DIAGNOSIS — E559 Vitamin D deficiency, unspecified: Secondary | ICD-10-CM | POA: Diagnosis not present

## 2023-07-26 ENCOUNTER — Other Ambulatory Visit (HOSPITAL_COMMUNITY): Payer: Self-pay | Admitting: Nephrology

## 2023-07-26 DIAGNOSIS — N1832 Chronic kidney disease, stage 3b: Secondary | ICD-10-CM

## 2023-08-02 ENCOUNTER — Ambulatory Visit (HOSPITAL_COMMUNITY)
Admission: RE | Admit: 2023-08-02 | Discharge: 2023-08-02 | Disposition: A | Discharge status: Home / Self Care | Payer: Medicare PPO | Source: Ambulatory Visit | Attending: Nephrology | Admitting: Nephrology

## 2023-08-02 DIAGNOSIS — N1832 Chronic kidney disease, stage 3b: Secondary | ICD-10-CM | POA: Diagnosis not present

## 2023-08-02 DIAGNOSIS — N189 Chronic kidney disease, unspecified: Secondary | ICD-10-CM | POA: Diagnosis not present

## 2023-09-24 DIAGNOSIS — N1832 Chronic kidney disease, stage 3b: Secondary | ICD-10-CM | POA: Diagnosis not present

## 2023-09-24 DIAGNOSIS — N189 Chronic kidney disease, unspecified: Secondary | ICD-10-CM | POA: Diagnosis not present

## 2023-09-24 DIAGNOSIS — D638 Anemia in other chronic diseases classified elsewhere: Secondary | ICD-10-CM | POA: Diagnosis not present

## 2023-09-24 DIAGNOSIS — Z5181 Encounter for therapeutic drug level monitoring: Secondary | ICD-10-CM | POA: Diagnosis not present

## 2023-09-24 DIAGNOSIS — R809 Proteinuria, unspecified: Secondary | ICD-10-CM | POA: Diagnosis not present

## 2023-09-24 DIAGNOSIS — I129 Hypertensive chronic kidney disease with stage 1 through stage 4 chronic kidney disease, or unspecified chronic kidney disease: Secondary | ICD-10-CM | POA: Diagnosis not present

## 2023-09-29 DIAGNOSIS — I129 Hypertensive chronic kidney disease with stage 1 through stage 4 chronic kidney disease, or unspecified chronic kidney disease: Secondary | ICD-10-CM | POA: Diagnosis not present

## 2023-09-29 DIAGNOSIS — D638 Anemia in other chronic diseases classified elsewhere: Secondary | ICD-10-CM | POA: Diagnosis not present

## 2023-09-29 DIAGNOSIS — N184 Chronic kidney disease, stage 4 (severe): Secondary | ICD-10-CM | POA: Diagnosis not present

## 2023-09-29 DIAGNOSIS — R809 Proteinuria, unspecified: Secondary | ICD-10-CM | POA: Diagnosis not present

## 2023-10-16 DIAGNOSIS — I10 Essential (primary) hypertension: Secondary | ICD-10-CM | POA: Diagnosis not present

## 2023-10-16 DIAGNOSIS — I129 Hypertensive chronic kidney disease with stage 1 through stage 4 chronic kidney disease, or unspecified chronic kidney disease: Secondary | ICD-10-CM | POA: Diagnosis not present

## 2023-10-16 DIAGNOSIS — R42 Dizziness and giddiness: Secondary | ICD-10-CM | POA: Diagnosis not present

## 2023-10-16 DIAGNOSIS — G2581 Restless legs syndrome: Secondary | ICD-10-CM | POA: Diagnosis not present

## 2023-10-16 DIAGNOSIS — R609 Edema, unspecified: Secondary | ICD-10-CM | POA: Diagnosis not present

## 2023-10-16 DIAGNOSIS — N184 Chronic kidney disease, stage 4 (severe): Secondary | ICD-10-CM | POA: Diagnosis not present

## 2023-12-13 DIAGNOSIS — E559 Vitamin D deficiency, unspecified: Secondary | ICD-10-CM | POA: Diagnosis not present

## 2023-12-13 DIAGNOSIS — N184 Chronic kidney disease, stage 4 (severe): Secondary | ICD-10-CM | POA: Diagnosis not present

## 2023-12-13 DIAGNOSIS — R7303 Prediabetes: Secondary | ICD-10-CM | POA: Diagnosis not present

## 2023-12-13 DIAGNOSIS — E211 Secondary hyperparathyroidism, not elsewhere classified: Secondary | ICD-10-CM | POA: Diagnosis not present

## 2023-12-13 DIAGNOSIS — E782 Mixed hyperlipidemia: Secondary | ICD-10-CM | POA: Diagnosis not present

## 2023-12-13 DIAGNOSIS — D649 Anemia, unspecified: Secondary | ICD-10-CM | POA: Diagnosis not present

## 2023-12-18 DIAGNOSIS — I10 Essential (primary) hypertension: Secondary | ICD-10-CM | POA: Diagnosis not present

## 2023-12-18 DIAGNOSIS — R609 Edema, unspecified: Secondary | ICD-10-CM | POA: Diagnosis not present

## 2023-12-18 DIAGNOSIS — D509 Iron deficiency anemia, unspecified: Secondary | ICD-10-CM | POA: Diagnosis not present

## 2023-12-18 DIAGNOSIS — I739 Peripheral vascular disease, unspecified: Secondary | ICD-10-CM | POA: Diagnosis not present

## 2023-12-18 DIAGNOSIS — R7303 Prediabetes: Secondary | ICD-10-CM | POA: Diagnosis not present

## 2023-12-18 DIAGNOSIS — I129 Hypertensive chronic kidney disease with stage 1 through stage 4 chronic kidney disease, or unspecified chronic kidney disease: Secondary | ICD-10-CM | POA: Diagnosis not present

## 2023-12-18 DIAGNOSIS — E782 Mixed hyperlipidemia: Secondary | ICD-10-CM | POA: Diagnosis not present

## 2023-12-18 DIAGNOSIS — G2581 Restless legs syndrome: Secondary | ICD-10-CM | POA: Diagnosis not present

## 2023-12-18 DIAGNOSIS — N184 Chronic kidney disease, stage 4 (severe): Secondary | ICD-10-CM | POA: Diagnosis not present

## 2023-12-20 DIAGNOSIS — D638 Anemia in other chronic diseases classified elsewhere: Secondary | ICD-10-CM | POA: Diagnosis not present

## 2023-12-20 DIAGNOSIS — N184 Chronic kidney disease, stage 4 (severe): Secondary | ICD-10-CM | POA: Diagnosis not present

## 2023-12-20 DIAGNOSIS — I129 Hypertensive chronic kidney disease with stage 1 through stage 4 chronic kidney disease, or unspecified chronic kidney disease: Secondary | ICD-10-CM | POA: Diagnosis not present

## 2023-12-20 DIAGNOSIS — R809 Proteinuria, unspecified: Secondary | ICD-10-CM | POA: Diagnosis not present

## 2024-01-02 DIAGNOSIS — N189 Chronic kidney disease, unspecified: Secondary | ICD-10-CM | POA: Diagnosis not present

## 2024-04-15 DIAGNOSIS — N189 Chronic kidney disease, unspecified: Secondary | ICD-10-CM | POA: Diagnosis not present

## 2024-04-15 DIAGNOSIS — E211 Secondary hyperparathyroidism, not elsewhere classified: Secondary | ICD-10-CM | POA: Diagnosis not present

## 2024-04-15 DIAGNOSIS — R809 Proteinuria, unspecified: Secondary | ICD-10-CM | POA: Diagnosis not present

## 2024-04-15 DIAGNOSIS — D631 Anemia in chronic kidney disease: Secondary | ICD-10-CM | POA: Diagnosis not present

## 2024-04-23 DIAGNOSIS — R809 Proteinuria, unspecified: Secondary | ICD-10-CM | POA: Diagnosis not present

## 2024-04-23 DIAGNOSIS — N1832 Chronic kidney disease, stage 3b: Secondary | ICD-10-CM | POA: Diagnosis not present

## 2024-04-23 DIAGNOSIS — I129 Hypertensive chronic kidney disease with stage 1 through stage 4 chronic kidney disease, or unspecified chronic kidney disease: Secondary | ICD-10-CM | POA: Diagnosis not present

## 2024-06-12 DIAGNOSIS — E782 Mixed hyperlipidemia: Secondary | ICD-10-CM | POA: Diagnosis not present

## 2024-06-12 DIAGNOSIS — R7303 Prediabetes: Secondary | ICD-10-CM | POA: Diagnosis not present

## 2024-06-12 DIAGNOSIS — E559 Vitamin D deficiency, unspecified: Secondary | ICD-10-CM | POA: Diagnosis not present

## 2024-06-19 DIAGNOSIS — I739 Peripheral vascular disease, unspecified: Secondary | ICD-10-CM | POA: Diagnosis not present

## 2024-06-19 DIAGNOSIS — D509 Iron deficiency anemia, unspecified: Secondary | ICD-10-CM | POA: Diagnosis not present

## 2024-06-19 DIAGNOSIS — G2581 Restless legs syndrome: Secondary | ICD-10-CM | POA: Diagnosis not present

## 2024-06-19 DIAGNOSIS — Z23 Encounter for immunization: Secondary | ICD-10-CM | POA: Diagnosis not present

## 2024-06-19 DIAGNOSIS — N184 Chronic kidney disease, stage 4 (severe): Secondary | ICD-10-CM | POA: Diagnosis not present

## 2024-06-19 DIAGNOSIS — E782 Mixed hyperlipidemia: Secondary | ICD-10-CM | POA: Diagnosis not present

## 2024-06-19 DIAGNOSIS — I129 Hypertensive chronic kidney disease with stage 1 through stage 4 chronic kidney disease, or unspecified chronic kidney disease: Secondary | ICD-10-CM | POA: Diagnosis not present

## 2024-06-19 DIAGNOSIS — I10 Essential (primary) hypertension: Secondary | ICD-10-CM | POA: Diagnosis not present

## 2024-06-19 DIAGNOSIS — R7303 Prediabetes: Secondary | ICD-10-CM | POA: Diagnosis not present

## 2024-07-23 DIAGNOSIS — D631 Anemia in chronic kidney disease: Secondary | ICD-10-CM | POA: Diagnosis not present

## 2024-07-23 DIAGNOSIS — N189 Chronic kidney disease, unspecified: Secondary | ICD-10-CM | POA: Diagnosis not present

## 2024-07-23 DIAGNOSIS — E211 Secondary hyperparathyroidism, not elsewhere classified: Secondary | ICD-10-CM | POA: Diagnosis not present

## 2024-07-23 DIAGNOSIS — R809 Proteinuria, unspecified: Secondary | ICD-10-CM | POA: Diagnosis not present

## 2024-07-30 DIAGNOSIS — R809 Proteinuria, unspecified: Secondary | ICD-10-CM | POA: Diagnosis not present

## 2024-07-30 DIAGNOSIS — D638 Anemia in other chronic diseases classified elsewhere: Secondary | ICD-10-CM | POA: Diagnosis not present

## 2024-07-30 DIAGNOSIS — N184 Chronic kidney disease, stage 4 (severe): Secondary | ICD-10-CM | POA: Diagnosis not present
